# Patient Record
Sex: Female | Born: 1945 | Race: White | Hispanic: No | Smoking: Never smoker
Health system: Southern US, Community
[De-identification: ages and names within clinical notes are randomized; demographics above are authoritative.]

## PROBLEM LIST (undated history)

## (undated) DIAGNOSIS — R32 Unspecified urinary incontinence: Secondary | ICD-10-CM

## (undated) DIAGNOSIS — I10 Essential (primary) hypertension: Secondary | ICD-10-CM

## (undated) DIAGNOSIS — Z8673 Personal history of transient ischemic attack (TIA), and cerebral infarction without residual deficits: Secondary | ICD-10-CM

## (undated) DIAGNOSIS — D61818 Other pancytopenia: Secondary | ICD-10-CM

## (undated) DIAGNOSIS — F0390 Unspecified dementia without behavioral disturbance: Secondary | ICD-10-CM

## (undated) DIAGNOSIS — F419 Anxiety disorder, unspecified: Secondary | ICD-10-CM

## (undated) DIAGNOSIS — R001 Bradycardia, unspecified: Secondary | ICD-10-CM

## (undated) DIAGNOSIS — E039 Hypothyroidism, unspecified: Secondary | ICD-10-CM

## (undated) DIAGNOSIS — D649 Anemia, unspecified: Secondary | ICD-10-CM

## (undated) DIAGNOSIS — E785 Hyperlipidemia, unspecified: Secondary | ICD-10-CM

## (undated) HISTORY — DX: Personal history of transient ischemic attack (TIA), and cerebral infarction without residual deficits: Z86.73

## (undated) HISTORY — DX: Bradycardia, unspecified: R00.1

## (undated) HISTORY — DX: Essential (primary) hypertension: I10

## (undated) HISTORY — DX: Other pancytopenia: D61.818

## (undated) HISTORY — DX: Anemia, unspecified: D64.9

## (undated) HISTORY — PX: HEMORRHOID SURGERY: SHX153

## (undated) HISTORY — PX: STOMACH SURGERY: SHX791

## (undated) HISTORY — DX: Unspecified dementia, unspecified severity, without behavioral disturbance, psychotic disturbance, mood disturbance, and anxiety: F03.90

## (undated) HISTORY — DX: Unspecified urinary incontinence: R32

## (undated) HISTORY — DX: Anxiety disorder, unspecified: F41.9

## (undated) HISTORY — DX: Hyperlipidemia, unspecified: E78.5

## (undated) HISTORY — PX: TUBAL LIGATION: SHX77

## (undated) HISTORY — DX: Hypothyroidism, unspecified: E03.9

---

## 2005-03-16 ENCOUNTER — Emergency Department (HOSPITAL_COMMUNITY): Admission: EM | Admit: 2005-03-16 | Discharge: 2005-03-16 | Payer: Self-pay | Admitting: Emergency Medicine

## 2006-11-30 ENCOUNTER — Emergency Department (HOSPITAL_COMMUNITY): Admission: EM | Admit: 2006-11-30 | Discharge: 2006-11-30 | Payer: Self-pay | Admitting: Emergency Medicine

## 2007-03-01 ENCOUNTER — Emergency Department (HOSPITAL_COMMUNITY): Admission: EM | Admit: 2007-03-01 | Discharge: 2007-03-02 | Payer: Self-pay | Admitting: Emergency Medicine

## 2009-03-06 IMAGING — CT CT EXTREM LOW W/O CM*R*
2 series · 15 of 20 positions shown, 18 images · IV contrast (agent unspecified)
Comparison: none

CLINICAL DATA: Fall, pain.  
CT OF THE RIGHT ANKLE WITHOUT CONTRAST:
TECHNIQUE: Multidetector CT imaging was performed according to the standard protocol.  Multiplanar CT image reconstructions were also generated.

[Series 4: extremity soft tissue 4.0 b30s · axial · 0.74mm/px · z∈[-244,-72]mm · 12 of 51 slices shown, 15 images]
[im 4/51  soft-tissue]
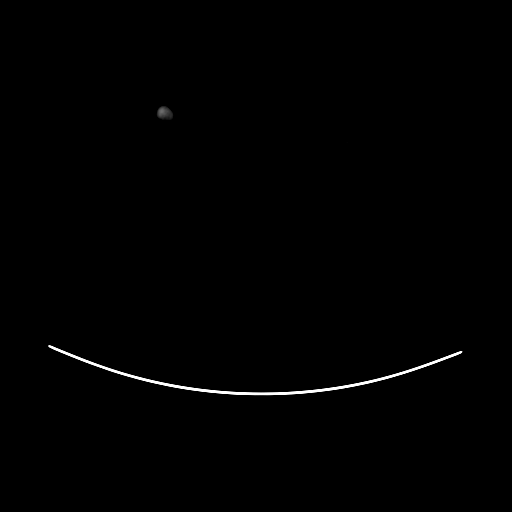
[im 4/51  bone]
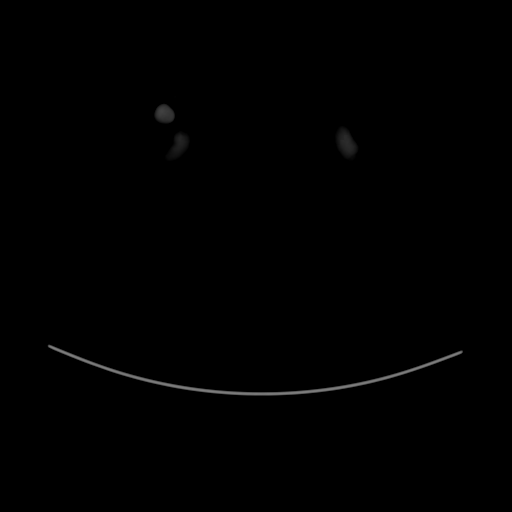
[im 8/51  bone]
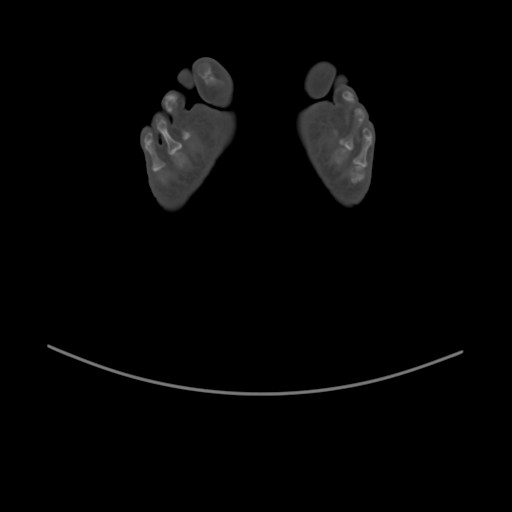
[im 12/51  bone]
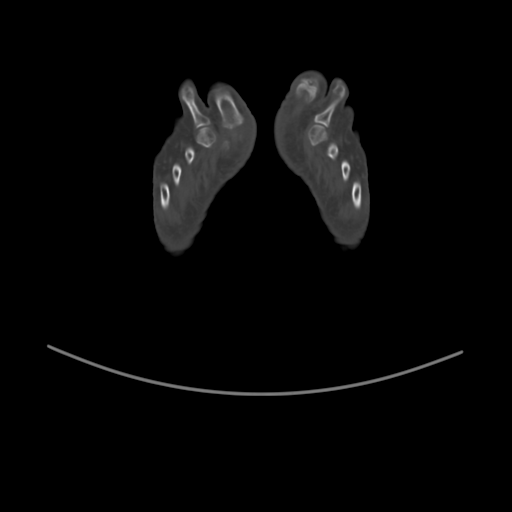
[im 16/51  bone]
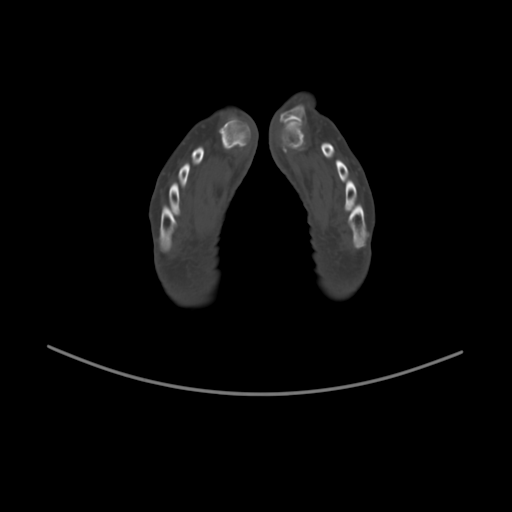
[im 20/51  soft-tissue]
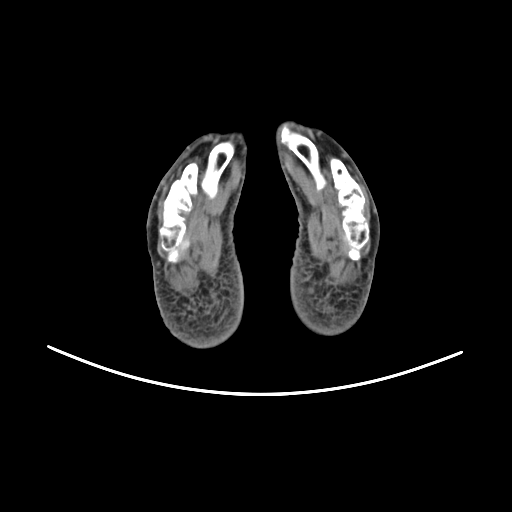
[im 20/51  bone]
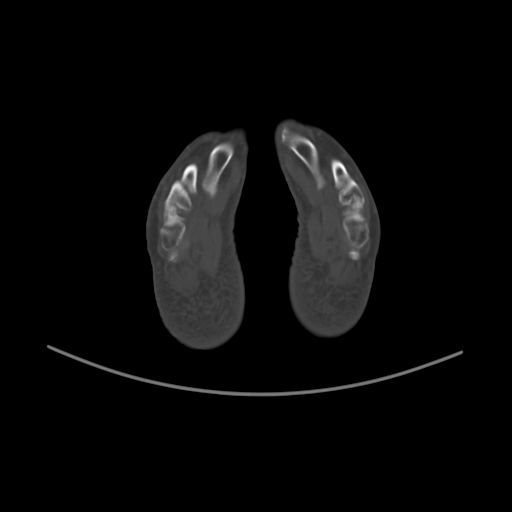
[im 24/51  bone]
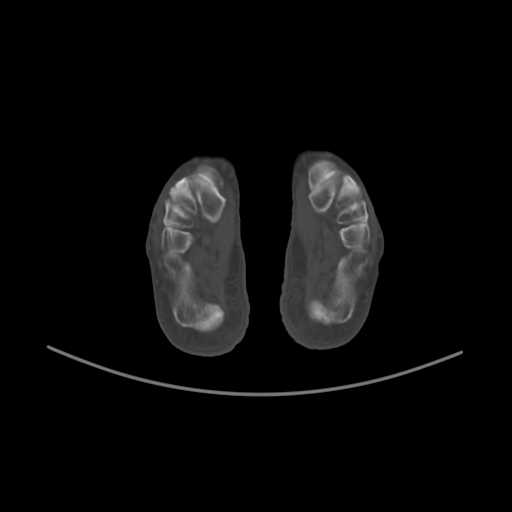
[im 27/51  bone]
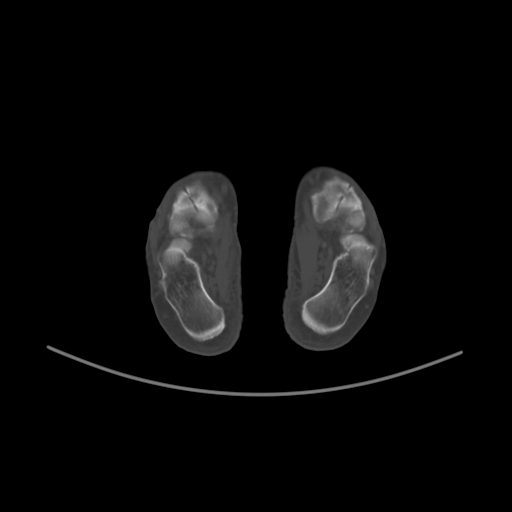
[im 31/51  bone]
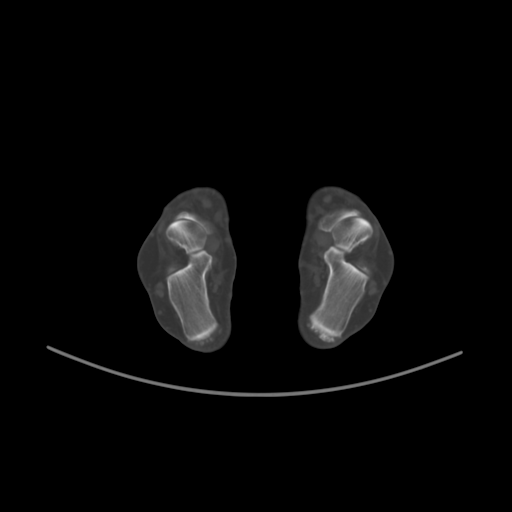
[im 35/51  soft-tissue]
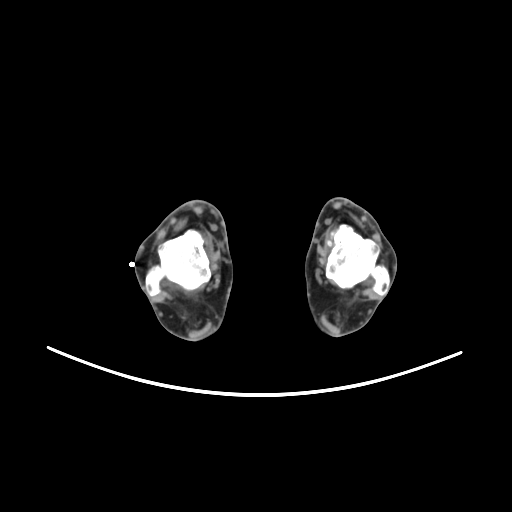
[im 35/51  bone]
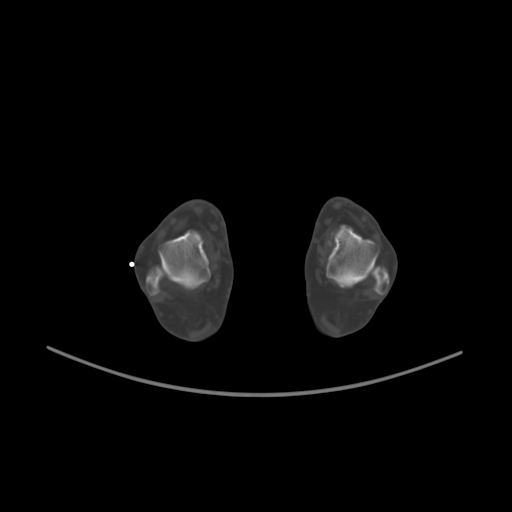
[im 39/51  bone]
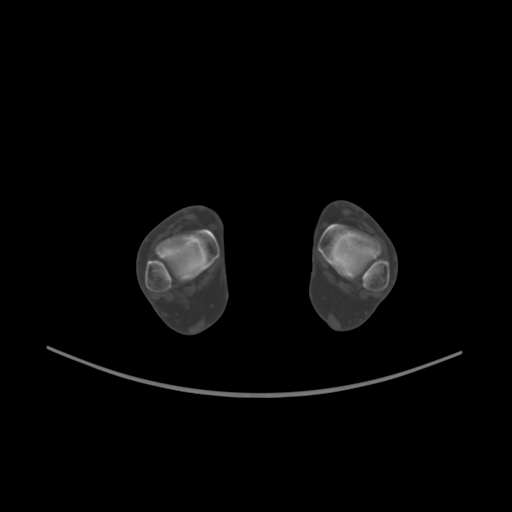
[im 43/51  bone]
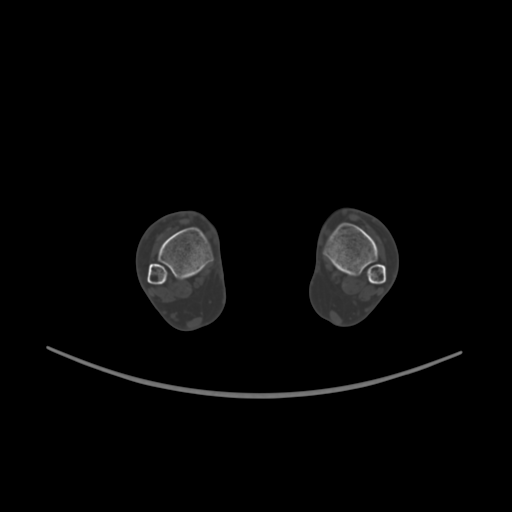
[im 47/51  bone]
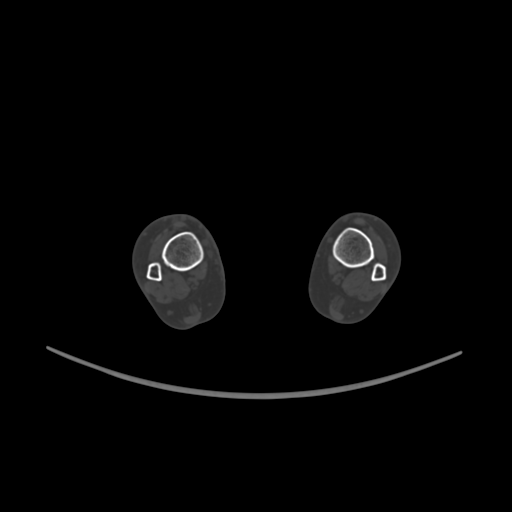

[Series 8: extremity 4.0 spo cor · coronal · 0.23mm/px · 3 of 33 slices shown]
[im 7/33  bone]
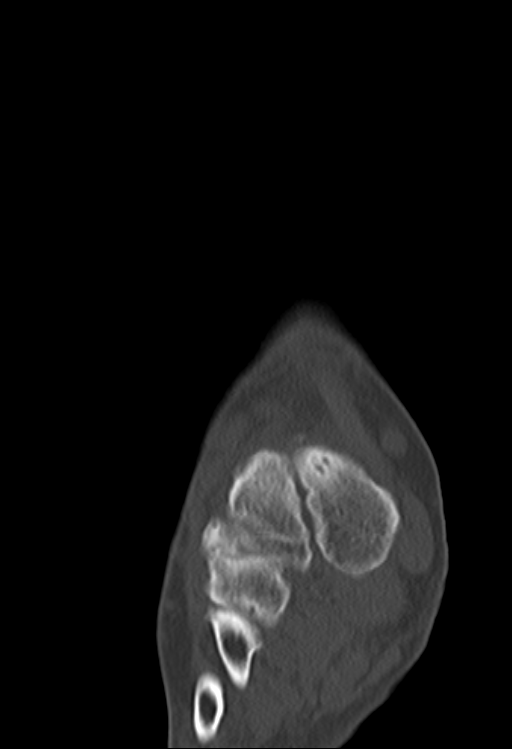
[im 13/33  bone]
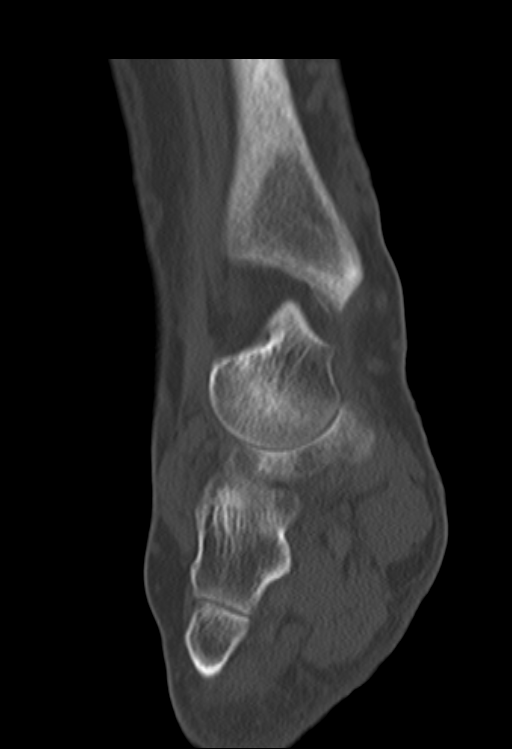
[im 20/33  bone]
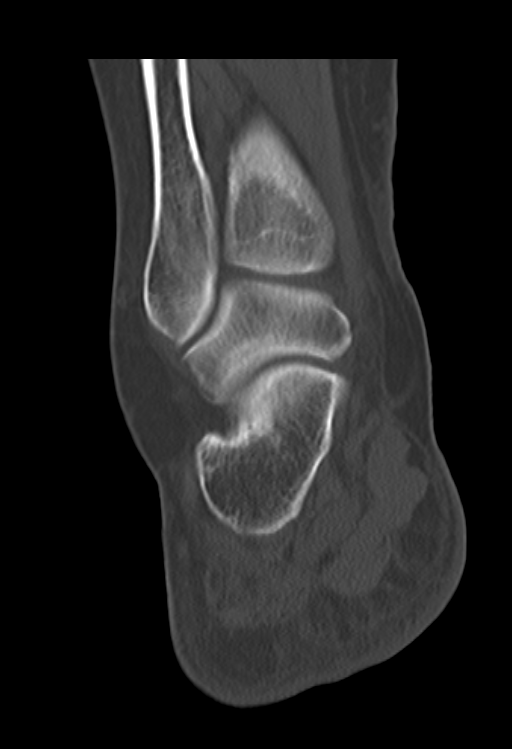

[15 of 20 positions shown; findings below may reference images not displayed]

FINDINGS: The left ankle is incidentally imaged in the study.  No fracture is identified.  There is some degenerative change about the midfoot.  There is mildly increased sclerosis in the medial aspect of the talus compared to lateral aspect suspicious for an osteochondral lesion.  Osteochondral lesion is clearly visualized in the left medial talus with cyst formation.  Soft tissue structures are unremarkable.
IMPRESSION: 1.  Negative for fracture. 
2.  Findings suspicious for a small osteochondral lesion in the medial talar dome on the right with more conspicuous osteochondral lesion seen in the left medial talar dome. MRI could be used for further evaluation.

## 2011-07-31 DIAGNOSIS — I6789 Other cerebrovascular disease: Secondary | ICD-10-CM

## 2015-05-30 DIAGNOSIS — Z0000 Encounter for general adult medical examination without abnormal findings: Secondary | ICD-10-CM | POA: Diagnosis not present

## 2015-05-30 DIAGNOSIS — E784 Other hyperlipidemia: Secondary | ICD-10-CM | POA: Diagnosis not present

## 2015-05-30 DIAGNOSIS — I10 Essential (primary) hypertension: Secondary | ICD-10-CM | POA: Diagnosis not present

## 2015-07-24 DIAGNOSIS — Z79899 Other long term (current) drug therapy: Secondary | ICD-10-CM | POA: Diagnosis not present

## 2015-07-24 DIAGNOSIS — R110 Nausea: Secondary | ICD-10-CM | POA: Diagnosis not present

## 2015-07-24 DIAGNOSIS — D649 Anemia, unspecified: Secondary | ICD-10-CM | POA: Diagnosis not present

## 2015-07-24 DIAGNOSIS — Z8673 Personal history of transient ischemic attack (TIA), and cerebral infarction without residual deficits: Secondary | ICD-10-CM | POA: Diagnosis not present

## 2015-07-24 DIAGNOSIS — R008 Other abnormalities of heart beat: Secondary | ICD-10-CM | POA: Diagnosis not present

## 2015-07-24 DIAGNOSIS — R001 Bradycardia, unspecified: Secondary | ICD-10-CM | POA: Diagnosis not present

## 2015-07-24 DIAGNOSIS — Z7982 Long term (current) use of aspirin: Secondary | ICD-10-CM | POA: Diagnosis not present

## 2015-07-24 DIAGNOSIS — D6489 Other specified anemias: Secondary | ICD-10-CM | POA: Diagnosis not present

## 2015-07-24 DIAGNOSIS — E784 Other hyperlipidemia: Secondary | ICD-10-CM | POA: Diagnosis not present

## 2015-07-24 DIAGNOSIS — E785 Hyperlipidemia, unspecified: Secondary | ICD-10-CM | POA: Diagnosis not present

## 2015-07-24 DIAGNOSIS — T447X5A Adverse effect of beta-adrenoreceptor antagonists, initial encounter: Secondary | ICD-10-CM | POA: Diagnosis not present

## 2015-07-24 DIAGNOSIS — Z885 Allergy status to narcotic agent status: Secondary | ICD-10-CM | POA: Diagnosis not present

## 2015-07-24 DIAGNOSIS — Z780 Asymptomatic menopausal state: Secondary | ICD-10-CM | POA: Diagnosis not present

## 2015-07-24 DIAGNOSIS — R42 Dizziness and giddiness: Secondary | ICD-10-CM | POA: Diagnosis not present

## 2015-07-25 DIAGNOSIS — Z8673 Personal history of transient ischemic attack (TIA), and cerebral infarction without residual deficits: Secondary | ICD-10-CM | POA: Diagnosis not present

## 2015-07-25 DIAGNOSIS — T447X5A Adverse effect of beta-adrenoreceptor antagonists, initial encounter: Secondary | ICD-10-CM | POA: Diagnosis not present

## 2015-07-25 DIAGNOSIS — E784 Other hyperlipidemia: Secondary | ICD-10-CM | POA: Diagnosis not present

## 2015-07-25 DIAGNOSIS — D6489 Other specified anemias: Secondary | ICD-10-CM | POA: Diagnosis not present

## 2015-07-25 DIAGNOSIS — R008 Other abnormalities of heart beat: Secondary | ICD-10-CM | POA: Diagnosis not present

## 2015-08-08 DIAGNOSIS — Z1389 Encounter for screening for other disorder: Secondary | ICD-10-CM | POA: Diagnosis not present

## 2015-08-08 DIAGNOSIS — E784 Other hyperlipidemia: Secondary | ICD-10-CM | POA: Diagnosis not present

## 2015-08-08 DIAGNOSIS — I10 Essential (primary) hypertension: Secondary | ICD-10-CM | POA: Diagnosis not present

## 2015-08-08 DIAGNOSIS — Z0000 Encounter for general adult medical examination without abnormal findings: Secondary | ICD-10-CM | POA: Diagnosis not present

## 2015-09-11 ENCOUNTER — Inpatient Hospital Stay: Admission: AD | Admit: 2015-09-11 | Payer: Self-pay | Source: Other Acute Inpatient Hospital | Admitting: Cardiology

## 2015-09-11 DIAGNOSIS — E785 Hyperlipidemia, unspecified: Secondary | ICD-10-CM | POA: Diagnosis not present

## 2015-09-11 DIAGNOSIS — Z79899 Other long term (current) drug therapy: Secondary | ICD-10-CM | POA: Diagnosis not present

## 2015-09-11 DIAGNOSIS — Z7982 Long term (current) use of aspirin: Secondary | ICD-10-CM | POA: Diagnosis not present

## 2015-09-11 DIAGNOSIS — I499 Cardiac arrhythmia, unspecified: Secondary | ICD-10-CM | POA: Diagnosis not present

## 2015-09-11 DIAGNOSIS — D638 Anemia in other chronic diseases classified elsewhere: Secondary | ICD-10-CM | POA: Diagnosis not present

## 2015-09-11 DIAGNOSIS — R001 Bradycardia, unspecified: Secondary | ICD-10-CM | POA: Diagnosis not present

## 2015-09-11 DIAGNOSIS — Y9289 Other specified places as the place of occurrence of the external cause: Secondary | ICD-10-CM | POA: Diagnosis not present

## 2015-09-11 DIAGNOSIS — R008 Other abnormalities of heart beat: Secondary | ICD-10-CM | POA: Diagnosis not present

## 2015-09-11 DIAGNOSIS — R42 Dizziness and giddiness: Secondary | ICD-10-CM | POA: Diagnosis not present

## 2015-09-11 DIAGNOSIS — E038 Other specified hypothyroidism: Secondary | ICD-10-CM | POA: Diagnosis not present

## 2015-09-11 DIAGNOSIS — T447X1A Poisoning by beta-adrenoreceptor antagonists, accidental (unintentional), initial encounter: Secondary | ICD-10-CM | POA: Diagnosis not present

## 2015-09-11 DIAGNOSIS — Z885 Allergy status to narcotic agent status: Secondary | ICD-10-CM | POA: Diagnosis not present

## 2015-09-11 DIAGNOSIS — E039 Hypothyroidism, unspecified: Secondary | ICD-10-CM | POA: Diagnosis not present

## 2015-09-12 ENCOUNTER — Encounter (HOSPITAL_COMMUNITY): Admission: RE | Payer: Self-pay | Source: Ambulatory Visit

## 2015-09-12 ENCOUNTER — Ambulatory Visit (HOSPITAL_COMMUNITY): Admission: RE | Admit: 2015-09-12 | Payer: Medicare Other | Source: Ambulatory Visit | Admitting: Internal Medicine

## 2015-09-12 DIAGNOSIS — E038 Other specified hypothyroidism: Secondary | ICD-10-CM | POA: Diagnosis not present

## 2015-09-12 DIAGNOSIS — R008 Other abnormalities of heart beat: Secondary | ICD-10-CM | POA: Diagnosis not present

## 2015-09-12 DIAGNOSIS — D638 Anemia in other chronic diseases classified elsewhere: Secondary | ICD-10-CM | POA: Diagnosis not present

## 2015-09-12 DIAGNOSIS — Y9289 Other specified places as the place of occurrence of the external cause: Secondary | ICD-10-CM | POA: Diagnosis not present

## 2015-09-12 DIAGNOSIS — T447X1A Poisoning by beta-adrenoreceptor antagonists, accidental (unintentional), initial encounter: Secondary | ICD-10-CM | POA: Diagnosis not present

## 2015-09-12 SURGERY — PACEMAKER IMPLANT
Anesthesia: LOCAL

## 2015-09-19 DIAGNOSIS — I10 Essential (primary) hypertension: Secondary | ICD-10-CM | POA: Diagnosis not present

## 2015-09-19 DIAGNOSIS — E784 Other hyperlipidemia: Secondary | ICD-10-CM | POA: Diagnosis not present

## 2015-09-19 DIAGNOSIS — R008 Other abnormalities of heart beat: Secondary | ICD-10-CM | POA: Diagnosis not present

## 2015-10-26 DIAGNOSIS — E784 Other hyperlipidemia: Secondary | ICD-10-CM | POA: Diagnosis not present

## 2015-10-26 DIAGNOSIS — R079 Chest pain, unspecified: Secondary | ICD-10-CM | POA: Diagnosis not present

## 2015-10-26 DIAGNOSIS — D61818 Other pancytopenia: Secondary | ICD-10-CM | POA: Diagnosis not present

## 2015-10-26 DIAGNOSIS — R001 Bradycardia, unspecified: Secondary | ICD-10-CM | POA: Diagnosis not present

## 2015-10-26 DIAGNOSIS — D649 Anemia, unspecified: Secondary | ICD-10-CM | POA: Diagnosis not present

## 2015-10-26 DIAGNOSIS — G459 Transient cerebral ischemic attack, unspecified: Secondary | ICD-10-CM | POA: Diagnosis not present

## 2015-10-26 DIAGNOSIS — R42 Dizziness and giddiness: Secondary | ICD-10-CM | POA: Diagnosis not present

## 2015-10-26 DIAGNOSIS — G458 Other transient cerebral ischemic attacks and related syndromes: Secondary | ICD-10-CM | POA: Diagnosis not present

## 2015-10-26 DIAGNOSIS — R008 Other abnormalities of heart beat: Secondary | ICD-10-CM | POA: Diagnosis not present

## 2015-10-26 DIAGNOSIS — E785 Hyperlipidemia, unspecified: Secondary | ICD-10-CM | POA: Diagnosis not present

## 2015-10-26 DIAGNOSIS — R404 Transient alteration of awareness: Secondary | ICD-10-CM | POA: Diagnosis not present

## 2015-10-27 DIAGNOSIS — D61818 Other pancytopenia: Secondary | ICD-10-CM | POA: Diagnosis not present

## 2015-10-27 DIAGNOSIS — G458 Other transient cerebral ischemic attacks and related syndromes: Secondary | ICD-10-CM | POA: Diagnosis not present

## 2015-10-27 DIAGNOSIS — R008 Other abnormalities of heart beat: Secondary | ICD-10-CM | POA: Diagnosis not present

## 2015-10-27 DIAGNOSIS — E784 Other hyperlipidemia: Secondary | ICD-10-CM | POA: Diagnosis not present

## 2015-10-28 ENCOUNTER — Encounter: Payer: Self-pay | Admitting: Cardiology

## 2015-10-28 NOTE — Progress Notes (Signed)
Cardiology Office Note  Date: 10/29/2015   ID: Natalie, Bridges 08-09-1945, MRN [id number]  PCP: Neale Burly, MD  Consulting Cardiologist: Rozann Lesches, MD   Chief Complaint  Patient presents with  . Bradycardia    History of Present Illness: Natalie Bridges is a 70 y.o. female referred for cardiology consultation by Dr. Sherrie Sport. I reviewed the available records. She was recently admitted to [facility] with dizziness, noted to be bradycardic, and admitted for observation. ECG showed sinus bradycardia with heart rate 39 beats per minute, decreased R wave progression, nonspecific T-wave changes. Troponin T levels were negative for ACS. She has a previous history of stroke, and had a follow-up head CT that showed evidence of old right MCA territory infarct but no acute findings. Discharge summary indicates that she was hydrated and discharged in stable condition. She was not on any AV nodal blocking drugs.  She tells me that she "feels fine" since hospital discharge. The event in question occurred after she had been outdoors mowing her grass with a self-propelled lawnmower. She went inside to sit down, had felt fine while she was outdoors, and then started to feel shaky and lightheaded as if she might pass out. At no point did she feel palpitations or chest pain. 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 Poor R-wave progression noted, rule out old anterior infarct pattern. She does not report any history of cardiac arrhythmia, CAD, or myocardial infarction.  Past Medical History:  Diagnosis Date  . Anemia   . Bradycardia   . Essential hypertension   . History of stroke   . Hyperlipemia   . Pancytopenia ([place])   . Urinary incontinence     Past Surgical History:  Procedure Laterality Date  . HEMORRHOID SURGERY    . TUBAL LIGATION       Current Outpatient Prescriptions  Medication Sig Dispense Refill  . amLODipine (NORVASC) 5 MG tablet Take 5 mg by mouth daily.    Marland Kitchen aspirin 325 MG EC tablet Take 325 mg by mouth daily.    Marland Kitchen atorvastatin (LIPITOR) 10 MG tablet Take 10 mg by mouth daily.    . ferrous sulfate 325 (65 FE) MG tablet Take 325 mg by mouth daily with breakfast.    . Omega-3 Fatty Acids (FISH OIL) 1000 MG CAPS Take 10,000 mg by mouth 3 (three) times daily.    Marland Kitchen oxybutynin (DITROPAN-XL) 5 MG 24 hr tablet Take 5 mg by mouth at bedtime.    . potassium chloride (K-DUR) 10 MEQ tablet Take 10 mEq by mouth daily.    . potassium chloride (MICRO-K) 10 MEQ CR capsule Take 10 mEq by mouth 2 (two) times daily.     No current facility-administered medications for this visit.    Allergies:  Codeine and Hydrocodone   Social History: The patient  reports that she has never smoked. She has never used smokeless tobacco. She reports that she does not drink alcohol or use drugs.   Family History: The patient's family history is not on file. She was adopted.   ROS:  Please see the history of present illness. Otherwise, complete review of systems is positive for NYHA class II dyspnea.  All other systems are reviewed and negative.   Physical Exam: VS:  BP 106/68 Comment: left arm  Pulse 64   Ht 5\' 5"  (1.651 m)  Wt 168 lb (76.2 kg)   LMP  (LMP Unknown)   SpO2 96%   BMI 27.96 kg/m , BMI Body mass index is 27.96 kg/m.  Wt Readings from Last 3 Encounters:  10/29/15 168 lb (76.2 kg)    General: Overweight woman, appears comfortable at rest. HEENT: Conjunctiva and lids normal, oropharynx clear. Neck: Supple, no elevated JVP or carotid bruits, no thyromegaly. Lungs: Clear to auscultation, nonlabored breathing at rest. Cardiac: Regular rate and rhythm, no S3 or significant systolic murmur, no pericardial rub. Abdomen: Soft, nontender, bowel sounds present, no guarding or rebound. Extremities: Spider veins and venous  varicosities in her lower legs, distal pulses 2+. Skin: Warm and dry. Musculoskeletal: No kyphosis. Neuropsychiatric: Alert and oriented x3, affect grossly appropriate.  ECG: There is no old tracing for comparison.  Recent Labwork:   August 2017: BUN 8, creatinine 0.7, potassium 3.4, AST 13, ALT 8, hemoglobin 10.6, platelets 145, the PVC 2.4  Assessment and Plan:  1. Recent episode of near-syncope at rest associated with subsequently documented bradycardia, although no definite heart block or other arrhythmias documented during hospital observation. States that she improved with hydration and has felt fine since hospital discharge. Could have potentially had a vasovagal reaction. Her ECG does suggest some component of conduction system disease with incomplete right bundle branch block and left anterior fascicular block however. Also poor R-wave progression, rule out old anterior infarct pattern. She is not reporting any angina with exertion. Plan is to obtain a 7 day event monitor to evaluate heart rate variability. Will also obtain an echocardiogram to assess cardiac structure and function.  2. Essential hypertension, currently on Norvasc. This would not be expected to have significant AV nodal blocking properties and contribute to bradycardia.  3. Previous history of stroke. No new neurological symptoms in terms of weakness or speech/memory problems. Head CT done at [facility] was consistent with old right MCA distribution infarct.  4. Hyperlipidemia, on Lipitor.  Current medicines were reviewed with the patient today.   Orders Placed This Encounter  Procedures  . Cardiac event monitor  . EKG 12-Lead  . ECHOCARDIOGRAM COMPLETE    Disposition: Call with results.  Signed, Satira Sark, MD, [facility] 10/29/2015 1:28 PM    [facility] [address] [place], [place] [address] Phone: [phone]; Fax: [phone]

## 2015-10-29 ENCOUNTER — Encounter: Payer: Self-pay | Admitting: Cardiology

## 2015-10-29 ENCOUNTER — Ambulatory Visit (INDEPENDENT_AMBULATORY_CARE_PROVIDER_SITE_OTHER): Payer: Medicare Other | Admitting: Cardiology

## 2015-10-29 VITALS — BP 106/68 | HR 64 | Ht 65.0 in | Wt 168.0 lb

## 2015-10-29 DIAGNOSIS — I10 Essential (primary) hypertension: Secondary | ICD-10-CM

## 2015-10-29 DIAGNOSIS — R001 Bradycardia, unspecified: Secondary | ICD-10-CM | POA: Diagnosis not present

## 2015-10-29 DIAGNOSIS — R9431 Abnormal electrocardiogram [ECG] [EKG]: Secondary | ICD-10-CM

## 2015-10-29 DIAGNOSIS — R55 Syncope and collapse: Secondary | ICD-10-CM | POA: Diagnosis not present

## 2015-10-29 NOTE — Patient Instructions (Signed)
Your physician recommends that you schedule a follow-up appointment in: we will call you with results    Your physician recommends that you continue on your current medications as directed. Please refer to the Current Medication list given to you today.    Your physician has requested that you have an echocardiogram at the [facility]. Echocardiography is a painless test that uses sound waves to create images of your heart. It provides your doctor with information about the size and shape of your heart and how well your heart's chambers and valves are working. This procedure takes approximately one hour. There are no restrictions for this procedure.   Your physician has recommended that you wear an event monitor for 7 days. Event monitors are medical devices that record the heart's electrical activity. Doctors most often [place] these monitors to diagnose arrhythmias. Arrhythmias are problems with the speed or rhythm of the heartbeat. The monitor is a small, portable device. You can wear one while you do your normal daily activities. This is usually used to diagnose what is causing palpitations/syncope (passing out).       Thank you for choosing [facility] !

## 2015-11-04 ENCOUNTER — Telehealth: Payer: Self-pay | Admitting: Cardiology
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
## 2015-11-04 NOTE — Telephone Encounter (Signed)
Patient's daughter has concerns about event monitor ordered at visit on 8/22. / tg

## 2015-11-05 ENCOUNTER — Ambulatory Visit (INDEPENDENT_AMBULATORY_CARE_PROVIDER_SITE_OTHER): Payer: Medicare Other

## 2015-11-05 DIAGNOSIS — R55 Syncope and collapse: Secondary | ICD-10-CM | POA: Diagnosis not present

## 2015-11-05 DIAGNOSIS — R001 Bradycardia, unspecified: Secondary | ICD-10-CM | POA: Diagnosis not present

## 2015-11-10 DIAGNOSIS — R008 Other abnormalities of heart beat: Secondary | ICD-10-CM | POA: Diagnosis not present

## 2015-11-10 DIAGNOSIS — E785 Hyperlipidemia, unspecified: Secondary | ICD-10-CM | POA: Diagnosis not present

## 2015-11-10 DIAGNOSIS — R0789 Other chest pain: Secondary | ICD-10-CM | POA: Diagnosis not present

## 2015-11-10 DIAGNOSIS — R079 Chest pain, unspecified: Secondary | ICD-10-CM | POA: Diagnosis not present

## 2015-11-10 DIAGNOSIS — Z7982 Long term (current) use of aspirin: Secondary | ICD-10-CM | POA: Diagnosis not present

## 2015-11-10 DIAGNOSIS — R001 Bradycardia, unspecified: Secondary | ICD-10-CM | POA: Diagnosis not present

## 2015-11-10 DIAGNOSIS — Z8673 Personal history of transient ischemic attack (TIA), and cerebral infarction without residual deficits: Secondary | ICD-10-CM | POA: Diagnosis not present

## 2015-11-10 DIAGNOSIS — I10 Essential (primary) hypertension: Secondary | ICD-10-CM | POA: Diagnosis not present

## 2015-11-10 DIAGNOSIS — Z885 Allergy status to narcotic agent status: Secondary | ICD-10-CM | POA: Diagnosis not present

## 2015-11-10 DIAGNOSIS — E784 Other hyperlipidemia: Secondary | ICD-10-CM | POA: Diagnosis not present

## 2015-11-10 DIAGNOSIS — Z79899 Other long term (current) drug therapy: Secondary | ICD-10-CM | POA: Diagnosis not present

## 2015-11-11 DIAGNOSIS — R0789 Other chest pain: Secondary | ICD-10-CM | POA: Diagnosis not present

## 2015-11-11 DIAGNOSIS — I10 Essential (primary) hypertension: Secondary | ICD-10-CM | POA: Diagnosis not present

## 2015-11-11 DIAGNOSIS — E784 Other hyperlipidemia: Secondary | ICD-10-CM | POA: Diagnosis not present

## 2015-11-11 DIAGNOSIS — R008 Other abnormalities of heart beat: Secondary | ICD-10-CM | POA: Diagnosis not present

## 2015-11-13 ENCOUNTER — Other Ambulatory Visit: Payer: Self-pay

## 2015-11-13 ENCOUNTER — Ambulatory Visit (INDEPENDENT_AMBULATORY_CARE_PROVIDER_SITE_OTHER): Payer: Medicare Other

## 2015-11-13 DIAGNOSIS — R001 Bradycardia, unspecified: Secondary | ICD-10-CM | POA: Diagnosis not present

## 2015-11-13 DIAGNOSIS — R55 Syncope and collapse: Secondary | ICD-10-CM | POA: Diagnosis not present

## 2015-11-14 DIAGNOSIS — K219 Gastro-esophageal reflux disease without esophagitis: Secondary | ICD-10-CM | POA: Diagnosis not present

## 2015-11-14 DIAGNOSIS — E784 Other hyperlipidemia: Secondary | ICD-10-CM | POA: Diagnosis not present

## 2015-11-14 DIAGNOSIS — I10 Essential (primary) hypertension: Secondary | ICD-10-CM | POA: Diagnosis not present

## 2015-12-30 DIAGNOSIS — K219 Gastro-esophageal reflux disease without esophagitis: Secondary | ICD-10-CM | POA: Diagnosis not present

## 2015-12-30 DIAGNOSIS — E784 Other hyperlipidemia: Secondary | ICD-10-CM | POA: Diagnosis not present

## 2015-12-30 DIAGNOSIS — I10 Essential (primary) hypertension: Secondary | ICD-10-CM | POA: Diagnosis not present

## 2016-02-28 DIAGNOSIS — R42 Dizziness and giddiness: Secondary | ICD-10-CM | POA: Diagnosis not present

## 2016-02-28 DIAGNOSIS — R531 Weakness: Secondary | ICD-10-CM | POA: Diagnosis not present

## 2016-02-28 DIAGNOSIS — H532 Diplopia: Secondary | ICD-10-CM | POA: Diagnosis not present

## 2016-02-28 DIAGNOSIS — E784 Other hyperlipidemia: Secondary | ICD-10-CM | POA: Diagnosis not present

## 2016-02-28 DIAGNOSIS — G458 Other transient cerebral ischemic attacks and related syndromes: Secondary | ICD-10-CM | POA: Diagnosis not present

## 2016-02-28 DIAGNOSIS — S0990XA Unspecified injury of head, initial encounter: Secondary | ICD-10-CM | POA: Diagnosis not present

## 2016-02-28 DIAGNOSIS — S299XXA Unspecified injury of thorax, initial encounter: Secondary | ICD-10-CM | POA: Diagnosis not present

## 2016-02-28 DIAGNOSIS — K8012 Calculus of gallbladder with acute and chronic cholecystitis without obstruction: Secondary | ICD-10-CM | POA: Diagnosis not present

## 2016-02-28 DIAGNOSIS — R945 Abnormal results of liver function studies: Secondary | ICD-10-CM | POA: Diagnosis not present

## 2016-02-28 DIAGNOSIS — R55 Syncope and collapse: Secondary | ICD-10-CM | POA: Diagnosis not present

## 2016-02-28 DIAGNOSIS — R404 Transient alteration of awareness: Secondary | ICD-10-CM | POA: Diagnosis not present

## 2016-02-28 DIAGNOSIS — R748 Abnormal levels of other serum enzymes: Secondary | ICD-10-CM | POA: Diagnosis not present

## 2016-02-28 DIAGNOSIS — I10 Essential (primary) hypertension: Secondary | ICD-10-CM | POA: Diagnosis not present

## 2016-02-29 DIAGNOSIS — R42 Dizziness and giddiness: Secondary | ICD-10-CM | POA: Diagnosis not present

## 2016-02-29 DIAGNOSIS — R55 Syncope and collapse: Secondary | ICD-10-CM | POA: Diagnosis not present

## 2016-02-29 DIAGNOSIS — G458 Other transient cerebral ischemic attacks and related syndromes: Secondary | ICD-10-CM | POA: Diagnosis not present

## 2016-02-29 DIAGNOSIS — G459 Transient cerebral ischemic attack, unspecified: Secondary | ICD-10-CM | POA: Diagnosis not present

## 2016-02-29 DIAGNOSIS — R1011 Right upper quadrant pain: Secondary | ICD-10-CM | POA: Diagnosis not present

## 2016-02-29 DIAGNOSIS — K8012 Calculus of gallbladder with acute and chronic cholecystitis without obstruction: Secondary | ICD-10-CM | POA: Diagnosis not present

## 2016-02-29 DIAGNOSIS — S299XXA Unspecified injury of thorax, initial encounter: Secondary | ICD-10-CM | POA: Diagnosis not present

## 2016-02-29 DIAGNOSIS — I10 Essential (primary) hypertension: Secondary | ICD-10-CM | POA: Diagnosis not present

## 2016-02-29 DIAGNOSIS — E784 Other hyperlipidemia: Secondary | ICD-10-CM | POA: Diagnosis not present

## 2016-02-29 DIAGNOSIS — K810 Acute cholecystitis: Secondary | ICD-10-CM | POA: Diagnosis not present

## 2016-02-29 DIAGNOSIS — R748 Abnormal levels of other serum enzymes: Secondary | ICD-10-CM | POA: Diagnosis not present

## 2016-02-29 DIAGNOSIS — N959 Unspecified menopausal and perimenopausal disorder: Secondary | ICD-10-CM | POA: Diagnosis not present

## 2016-02-29 DIAGNOSIS — H532 Diplopia: Secondary | ICD-10-CM | POA: Diagnosis not present

## 2016-02-29 DIAGNOSIS — Z23 Encounter for immunization: Secondary | ICD-10-CM | POA: Diagnosis not present

## 2016-02-29 DIAGNOSIS — Z885 Allergy status to narcotic agent status: Secondary | ICD-10-CM | POA: Diagnosis not present

## 2016-02-29 DIAGNOSIS — S0990XA Unspecified injury of head, initial encounter: Secondary | ICD-10-CM | POA: Diagnosis not present

## 2016-02-29 DIAGNOSIS — E785 Hyperlipidemia, unspecified: Secondary | ICD-10-CM | POA: Diagnosis not present

## 2016-02-29 DIAGNOSIS — R945 Abnormal results of liver function studies: Secondary | ICD-10-CM | POA: Diagnosis not present

## 2016-03-01 DIAGNOSIS — I10 Essential (primary) hypertension: Secondary | ICD-10-CM | POA: Diagnosis not present

## 2016-03-01 DIAGNOSIS — K810 Acute cholecystitis: Secondary | ICD-10-CM | POA: Diagnosis not present

## 2016-03-02 DIAGNOSIS — Z23 Encounter for immunization: Secondary | ICD-10-CM | POA: Diagnosis not present

## 2016-03-10 DIAGNOSIS — E784 Other hyperlipidemia: Secondary | ICD-10-CM | POA: Diagnosis not present

## 2016-03-10 DIAGNOSIS — K810 Acute cholecystitis: Secondary | ICD-10-CM | POA: Diagnosis not present

## 2016-03-10 DIAGNOSIS — G3184 Mild cognitive impairment, so stated: Secondary | ICD-10-CM | POA: Diagnosis not present

## 2016-03-10 DIAGNOSIS — I10 Essential (primary) hypertension: Secondary | ICD-10-CM | POA: Diagnosis not present

## 2016-03-10 DIAGNOSIS — Z6823 Body mass index (BMI) 23.0-23.9, adult: Secondary | ICD-10-CM | POA: Diagnosis not present

## 2016-03-13 DIAGNOSIS — R0789 Other chest pain: Secondary | ICD-10-CM | POA: Diagnosis not present

## 2016-03-13 DIAGNOSIS — R945 Abnormal results of liver function studies: Secondary | ICD-10-CM | POA: Diagnosis not present

## 2016-03-13 DIAGNOSIS — R935 Abnormal findings on diagnostic imaging of other abdominal regions, including retroperitoneum: Secondary | ICD-10-CM | POA: Diagnosis not present

## 2016-03-13 DIAGNOSIS — R061 Stridor: Secondary | ICD-10-CM | POA: Diagnosis not present

## 2016-03-13 DIAGNOSIS — R072 Precordial pain: Secondary | ICD-10-CM | POA: Diagnosis not present

## 2016-03-13 DIAGNOSIS — K858 Other acute pancreatitis without necrosis or infection: Secondary | ICD-10-CM | POA: Diagnosis not present

## 2016-03-13 DIAGNOSIS — E876 Hypokalemia: Secondary | ICD-10-CM | POA: Diagnosis not present

## 2016-03-13 DIAGNOSIS — R079 Chest pain, unspecified: Secondary | ICD-10-CM | POA: Diagnosis not present

## 2016-03-13 DIAGNOSIS — R7989 Other specified abnormal findings of blood chemistry: Secondary | ICD-10-CM | POA: Diagnosis not present

## 2016-03-13 DIAGNOSIS — K830 Cholangitis: Secondary | ICD-10-CM | POA: Diagnosis not present

## 2016-03-13 DIAGNOSIS — E441 Mild protein-calorie malnutrition: Secondary | ICD-10-CM | POA: Diagnosis not present

## 2016-03-13 DIAGNOSIS — I209 Angina pectoris, unspecified: Secondary | ICD-10-CM | POA: Diagnosis not present

## 2016-03-14 DIAGNOSIS — R7989 Other specified abnormal findings of blood chemistry: Secondary | ICD-10-CM | POA: Diagnosis not present

## 2016-03-14 DIAGNOSIS — I10 Essential (primary) hypertension: Secondary | ICD-10-CM | POA: Diagnosis not present

## 2016-03-14 DIAGNOSIS — Z8673 Personal history of transient ischemic attack (TIA), and cerebral infarction without residual deficits: Secondary | ICD-10-CM | POA: Diagnosis not present

## 2016-03-14 DIAGNOSIS — K219 Gastro-esophageal reflux disease without esophagitis: Secondary | ICD-10-CM | POA: Diagnosis not present

## 2016-03-14 DIAGNOSIS — E876 Hypokalemia: Secondary | ICD-10-CM | POA: Diagnosis not present

## 2016-03-14 DIAGNOSIS — Z6820 Body mass index (BMI) 20.0-20.9, adult: Secondary | ICD-10-CM | POA: Diagnosis not present

## 2016-03-14 DIAGNOSIS — R072 Precordial pain: Secondary | ICD-10-CM | POA: Diagnosis not present

## 2016-03-14 DIAGNOSIS — E441 Mild protein-calorie malnutrition: Secondary | ICD-10-CM | POA: Diagnosis not present

## 2016-03-14 DIAGNOSIS — Z886 Allergy status to analgesic agent status: Secondary | ICD-10-CM | POA: Diagnosis not present

## 2016-03-14 DIAGNOSIS — K859 Acute pancreatitis without necrosis or infection, unspecified: Secondary | ICD-10-CM | POA: Diagnosis not present

## 2016-03-14 DIAGNOSIS — K830 Cholangitis: Secondary | ICD-10-CM | POA: Diagnosis not present

## 2016-03-14 DIAGNOSIS — Z780 Asymptomatic menopausal state: Secondary | ICD-10-CM | POA: Diagnosis not present

## 2016-03-14 DIAGNOSIS — I714 Abdominal aortic aneurysm, without rupture: Secondary | ICD-10-CM | POA: Diagnosis not present

## 2016-03-14 DIAGNOSIS — Z7982 Long term (current) use of aspirin: Secondary | ICD-10-CM | POA: Diagnosis not present

## 2016-03-14 DIAGNOSIS — I209 Angina pectoris, unspecified: Secondary | ICD-10-CM | POA: Diagnosis not present

## 2016-03-14 DIAGNOSIS — R0789 Other chest pain: Secondary | ICD-10-CM | POA: Diagnosis not present

## 2016-03-14 DIAGNOSIS — K858 Other acute pancreatitis without necrosis or infection: Secondary | ICD-10-CM | POA: Diagnosis not present

## 2016-03-14 DIAGNOSIS — Z79899 Other long term (current) drug therapy: Secondary | ICD-10-CM | POA: Diagnosis not present

## 2016-03-14 DIAGNOSIS — R17 Unspecified jaundice: Secondary | ICD-10-CM | POA: Diagnosis not present

## 2016-03-14 DIAGNOSIS — R079 Chest pain, unspecified: Secondary | ICD-10-CM | POA: Diagnosis not present

## 2016-03-14 DIAGNOSIS — E785 Hyperlipidemia, unspecified: Secondary | ICD-10-CM | POA: Diagnosis not present

## 2016-03-23 DIAGNOSIS — K8582 Other acute pancreatitis with infected necrosis: Secondary | ICD-10-CM | POA: Diagnosis not present

## 2016-03-23 DIAGNOSIS — I10 Essential (primary) hypertension: Secondary | ICD-10-CM | POA: Diagnosis not present

## 2016-03-23 DIAGNOSIS — E784 Other hyperlipidemia: Secondary | ICD-10-CM | POA: Diagnosis not present

## 2016-03-23 DIAGNOSIS — Z6823 Body mass index (BMI) 23.0-23.9, adult: Secondary | ICD-10-CM | POA: Diagnosis not present

## 2016-06-09 DIAGNOSIS — Z131 Encounter for screening for diabetes mellitus: Secondary | ICD-10-CM | POA: Diagnosis not present

## 2016-06-09 DIAGNOSIS — Z1389 Encounter for screening for other disorder: Secondary | ICD-10-CM | POA: Diagnosis not present

## 2016-06-09 DIAGNOSIS — E038 Other specified hypothyroidism: Secondary | ICD-10-CM | POA: Diagnosis not present

## 2016-06-09 DIAGNOSIS — I10 Essential (primary) hypertension: Secondary | ICD-10-CM | POA: Diagnosis not present

## 2016-06-09 DIAGNOSIS — Z0000 Encounter for general adult medical examination without abnormal findings: Secondary | ICD-10-CM | POA: Diagnosis not present

## 2016-06-09 DIAGNOSIS — Z79899 Other long term (current) drug therapy: Secondary | ICD-10-CM | POA: Diagnosis not present

## 2016-06-09 DIAGNOSIS — Z6823 Body mass index (BMI) 23.0-23.9, adult: Secondary | ICD-10-CM | POA: Diagnosis not present

## 2016-06-09 DIAGNOSIS — E784 Other hyperlipidemia: Secondary | ICD-10-CM | POA: Diagnosis not present

## 2016-06-18 DIAGNOSIS — E785 Hyperlipidemia, unspecified: Secondary | ICD-10-CM | POA: Diagnosis not present

## 2016-06-18 DIAGNOSIS — R001 Bradycardia, unspecified: Secondary | ICD-10-CM | POA: Diagnosis not present

## 2016-06-18 DIAGNOSIS — Z8673 Personal history of transient ischemic attack (TIA), and cerebral infarction without residual deficits: Secondary | ICD-10-CM | POA: Diagnosis not present

## 2016-06-18 DIAGNOSIS — Z886 Allergy status to analgesic agent status: Secondary | ICD-10-CM | POA: Diagnosis not present

## 2016-06-18 DIAGNOSIS — E872 Acidosis: Secondary | ICD-10-CM | POA: Diagnosis not present

## 2016-06-18 DIAGNOSIS — R1111 Vomiting without nausea: Secondary | ICD-10-CM | POA: Diagnosis not present

## 2016-06-18 DIAGNOSIS — Z9049 Acquired absence of other specified parts of digestive tract: Secondary | ICD-10-CM | POA: Diagnosis not present

## 2016-06-18 DIAGNOSIS — R55 Syncope and collapse: Secondary | ICD-10-CM | POA: Diagnosis not present

## 2016-06-18 DIAGNOSIS — I714 Abdominal aortic aneurysm, without rupture: Secondary | ICD-10-CM | POA: Diagnosis not present

## 2016-06-18 DIAGNOSIS — I10 Essential (primary) hypertension: Secondary | ICD-10-CM | POA: Diagnosis not present

## 2016-06-18 DIAGNOSIS — K529 Noninfective gastroenteritis and colitis, unspecified: Secondary | ICD-10-CM | POA: Diagnosis not present

## 2016-06-18 DIAGNOSIS — R531 Weakness: Secondary | ICD-10-CM | POA: Diagnosis not present

## 2016-06-18 DIAGNOSIS — E873 Alkalosis: Secondary | ICD-10-CM | POA: Diagnosis not present

## 2016-06-18 DIAGNOSIS — Z7982 Long term (current) use of aspirin: Secondary | ICD-10-CM | POA: Diagnosis not present

## 2016-06-18 DIAGNOSIS — K5289 Other specified noninfective gastroenteritis and colitis: Secondary | ICD-10-CM | POA: Diagnosis not present

## 2016-06-18 DIAGNOSIS — R404 Transient alteration of awareness: Secondary | ICD-10-CM | POA: Diagnosis not present

## 2016-06-18 DIAGNOSIS — R064 Hyperventilation: Secondary | ICD-10-CM | POA: Diagnosis not present

## 2016-06-18 DIAGNOSIS — Z79899 Other long term (current) drug therapy: Secondary | ICD-10-CM | POA: Diagnosis not present

## 2016-06-18 DIAGNOSIS — K219 Gastro-esophageal reflux disease without esophagitis: Secondary | ICD-10-CM | POA: Diagnosis not present

## 2016-06-19 DIAGNOSIS — I10 Essential (primary) hypertension: Secondary | ICD-10-CM | POA: Diagnosis not present

## 2016-06-19 DIAGNOSIS — K5289 Other specified noninfective gastroenteritis and colitis: Secondary | ICD-10-CM | POA: Diagnosis not present

## 2016-06-19 DIAGNOSIS — R55 Syncope and collapse: Secondary | ICD-10-CM | POA: Diagnosis not present

## 2016-06-19 DIAGNOSIS — E873 Alkalosis: Secondary | ICD-10-CM | POA: Diagnosis not present

## 2016-06-19 DIAGNOSIS — E872 Acidosis: Secondary | ICD-10-CM | POA: Diagnosis not present

## 2016-06-20 DIAGNOSIS — R079 Chest pain, unspecified: Secondary | ICD-10-CM | POA: Diagnosis not present

## 2016-06-20 DIAGNOSIS — R0789 Other chest pain: Secondary | ICD-10-CM | POA: Diagnosis not present

## 2016-06-20 DIAGNOSIS — R112 Nausea with vomiting, unspecified: Secondary | ICD-10-CM | POA: Diagnosis not present

## 2016-06-20 DIAGNOSIS — R0602 Shortness of breath: Secondary | ICD-10-CM | POA: Diagnosis not present

## 2016-06-20 DIAGNOSIS — R42 Dizziness and giddiness: Secondary | ICD-10-CM | POA: Diagnosis not present

## 2016-06-20 DIAGNOSIS — R55 Syncope and collapse: Secondary | ICD-10-CM | POA: Diagnosis not present

## 2016-06-20 DIAGNOSIS — I714 Abdominal aortic aneurysm, without rupture: Secondary | ICD-10-CM | POA: Diagnosis not present

## 2016-06-20 DIAGNOSIS — I444 Left anterior fascicular block: Secondary | ICD-10-CM | POA: Diagnosis not present

## 2016-06-20 DIAGNOSIS — Z886 Allergy status to analgesic agent status: Secondary | ICD-10-CM | POA: Diagnosis not present

## 2016-06-20 DIAGNOSIS — Z7982 Long term (current) use of aspirin: Secondary | ICD-10-CM | POA: Diagnosis not present

## 2016-06-20 DIAGNOSIS — Z8673 Personal history of transient ischemic attack (TIA), and cerebral infarction without residual deficits: Secondary | ICD-10-CM | POA: Diagnosis not present

## 2016-06-20 DIAGNOSIS — Z9049 Acquired absence of other specified parts of digestive tract: Secondary | ICD-10-CM | POA: Diagnosis not present

## 2016-06-20 DIAGNOSIS — R109 Unspecified abdominal pain: Secondary | ICD-10-CM | POA: Diagnosis not present

## 2016-06-20 DIAGNOSIS — R001 Bradycardia, unspecified: Secondary | ICD-10-CM | POA: Diagnosis not present

## 2016-06-20 DIAGNOSIS — I10 Essential (primary) hypertension: Secondary | ICD-10-CM | POA: Diagnosis not present

## 2016-06-20 DIAGNOSIS — Z79899 Other long term (current) drug therapy: Secondary | ICD-10-CM | POA: Diagnosis not present

## 2016-06-20 DIAGNOSIS — R197 Diarrhea, unspecified: Secondary | ICD-10-CM | POA: Diagnosis not present

## 2016-06-20 DIAGNOSIS — E785 Hyperlipidemia, unspecified: Secondary | ICD-10-CM | POA: Diagnosis not present

## 2016-06-20 DIAGNOSIS — E873 Alkalosis: Secondary | ICD-10-CM | POA: Diagnosis not present

## 2016-06-20 DIAGNOSIS — R1084 Generalized abdominal pain: Secondary | ICD-10-CM | POA: Diagnosis not present

## 2016-06-21 DIAGNOSIS — R42 Dizziness and giddiness: Secondary | ICD-10-CM | POA: Diagnosis not present

## 2016-06-21 DIAGNOSIS — R1084 Generalized abdominal pain: Secondary | ICD-10-CM | POA: Diagnosis not present

## 2016-06-21 DIAGNOSIS — R55 Syncope and collapse: Secondary | ICD-10-CM | POA: Diagnosis not present

## 2016-06-21 DIAGNOSIS — R0789 Other chest pain: Secondary | ICD-10-CM | POA: Diagnosis not present

## 2016-06-21 DIAGNOSIS — R197 Diarrhea, unspecified: Secondary | ICD-10-CM | POA: Diagnosis not present

## 2016-06-21 DIAGNOSIS — R109 Unspecified abdominal pain: Secondary | ICD-10-CM | POA: Diagnosis not present

## 2016-06-21 DIAGNOSIS — R1110 Vomiting, unspecified: Secondary | ICD-10-CM | POA: Diagnosis not present

## 2016-06-21 DIAGNOSIS — E873 Alkalosis: Secondary | ICD-10-CM | POA: Diagnosis not present

## 2016-06-21 DIAGNOSIS — I714 Abdominal aortic aneurysm, without rupture: Secondary | ICD-10-CM | POA: Diagnosis not present

## 2016-06-22 DIAGNOSIS — R1084 Generalized abdominal pain: Secondary | ICD-10-CM | POA: Diagnosis not present

## 2016-06-22 DIAGNOSIS — R55 Syncope and collapse: Secondary | ICD-10-CM | POA: Diagnosis not present

## 2016-06-22 DIAGNOSIS — I714 Abdominal aortic aneurysm, without rupture: Secondary | ICD-10-CM | POA: Diagnosis not present

## 2016-06-22 DIAGNOSIS — R0789 Other chest pain: Secondary | ICD-10-CM | POA: Diagnosis not present

## 2016-06-22 DIAGNOSIS — E873 Alkalosis: Secondary | ICD-10-CM | POA: Diagnosis not present

## 2016-06-29 ENCOUNTER — Encounter: Payer: Medicare Other | Admitting: Adult Health

## 2016-06-29 ENCOUNTER — Encounter: Payer: Self-pay | Admitting: Adult Health

## 2016-06-29 DIAGNOSIS — I10 Essential (primary) hypertension: Secondary | ICD-10-CM | POA: Diagnosis not present

## 2016-06-29 DIAGNOSIS — E784 Other hyperlipidemia: Secondary | ICD-10-CM | POA: Diagnosis not present

## 2016-06-29 DIAGNOSIS — Z6823 Body mass index (BMI) 23.0-23.9, adult: Secondary | ICD-10-CM | POA: Diagnosis not present

## 2016-06-29 NOTE — Progress Notes (Deleted)
Cardiology Office Note   Date:  06/29/2016   ID:  Tanekia, Ryans [birth year], MRN [id number]  PCP:  Neale Burly, MD  Cardiologist: [place]/  Jory Sims, NP   No chief complaint on file.     History of Present Illness: Natalie Bridges is a 71 y.o. female who presents for ongoing assessment and management of bradycardia, after being seen on consultation by Dr. Domenic Polite, on 10/29/2015. The patient apparently had been admitted to [facility] with dizziness and had a heart rate of 39 bpm. On review of EKGs by Dr. Domenic Polite, ECG suggested some component of conduction system disease with incomplete right bundle branch block and left anterior vesicular block. An echocardiogram was ordered to assess LV function and for structural heart disease. A cardiac monitor was also placed for evaluation of heart rate.  Cardiac monitor results revealed that there was no slow heart rates lower than 50 bpm, no pauses, but explain syncope.  Echocardiogram 11/13/2015 Left ventricle: The cavity size was normal. Wall thickness was   increased in a pattern of mild LVH. Systolic function was normal.   The estimated ejection fraction was in the range of 55% to 60%.   Wall motion was normal; there were no regional wall motion   abnormalities. Left ventricular diastolic function parameters   were normal. - Aortic valve: Valve area (VTI): 2.29 cm^2. Valve area (Vmax):   2.54 cm^2. Valve area (Vmean): 2.27 cm^2. - Technically adequate study.  Past Medical History:  Diagnosis Date  . Anemia   . Bradycardia   . Essential hypertension   . History of stroke   . Hyperlipemia   . Pancytopenia ([place])   . Urinary incontinence     Past Surgical History:  Procedure Laterality Date  . HEMORRHOID SURGERY    . TUBAL LIGATION       Current Outpatient Prescriptions  Medication Sig Dispense Refill  . amLODipine (NORVASC) 5 MG tablet Take 5 mg by mouth daily.    Marland Kitchen aspirin 325 MG EC tablet Take 325 mg by  mouth daily.    Marland Kitchen atorvastatin (LIPITOR) 10 MG tablet Take 10 mg by mouth daily.    . ferrous sulfate 325 (65 FE) MG tablet Take 325 mg by mouth daily with breakfast.    . Omega-3 Fatty Acids (FISH OIL) 1000 MG CAPS Take 10,000 mg by mouth 3 (three) times daily.    Marland Kitchen oxybutynin (DITROPAN-XL) 5 MG 24 hr tablet Take 5 mg by mouth at bedtime.    . potassium chloride (K-DUR) 10 MEQ tablet Take 10 mEq by mouth daily.    . potassium chloride (MICRO-K) 10 MEQ CR capsule Take 10 mEq by mouth 2 (two) times daily.     No current facility-administered medications for this visit.     Allergies:   Codeine and Hydrocodone    Social History:  The patient  reports that she has never smoked. She has never used smokeless tobacco. She reports that she does not drink alcohol or use drugs.   Family History:  The patient's family history is not on file. She was adopted.    ROS: All other systems are reviewed and negative. Unless otherwise mentioned in H&P    PHYSICAL EXAM: VS:  LMP  (LMP Unknown)  , BMI There is no height or weight on file to calculate BMI. GEN: Well nourished, well developed, in no acute distress HEENT: normal Neck: no JVD, carotid bruits, or masses Cardiac: ***RRR; no murmurs, rubs, or gallops,no edema  Respiratory:  clear to auscultation bilaterally, normal work of breathing GI: soft, nontender, nondistended, + BS MS: no deformity or atrophy Skin: warm and dry, no rash Neuro:  Strength and sensation are intact Psych: euthymic mood, full affect   EKG:  EKG {ACTION; IS/IS [id number]} ordered today. The ekg ordered today demonstrates ***   Recent Labs: No results found for requested labs within last 8760 hours.    Lipid Panel No results found for: CHOL, TRIG, HDL, CHOLHDL, VLDL, LDLCALC, LDLDIRECT    Wt Readings from Last 3 Encounters:  10/29/15 168 lb (76.2 kg)      Other studies Reviewed: Additional studies/ records that were reviewed today include: ***. Review  of the above records demonstrates: ***   ASSESSMENT AND PLAN:  1.  ***   Current medicines are reviewed at length with the patient today.    Labs/ tests ordered today include: *** No orders of the defined types were placed in this encounter.    Disposition:   FU with   Signed, Jory Sims, NP  06/29/2016 7:25 AM    [facility] [address] [place], [place] [address] Phone: [phone]; Fax: [phone]

## 2016-07-06 ENCOUNTER — Encounter: Payer: Medicare Other | Admitting: Adult Health

## 2016-07-06 ENCOUNTER — Encounter: Payer: Self-pay | Admitting: *Deleted

## 2016-07-06 NOTE — Progress Notes (Deleted)
Cardiology Office Note   Date:  07/06/2016   ID:  Natalie Bridges, DOB Aug 17, 1945, MRN [id number]  PCP:  Neale Burly, MD  Cardiologist:  Domenic Polite, MD   No chief complaint on file.     History of Present Illness: Natalie Bridges is a 71 y.o. female who presents for posthospitalization follow-up after being seen in [place] for episode of syncope. Was last seen by cardiology on 10/29/2015, with a history of bradycardia. Patient also has a history of CVA was old right MCA territory infarct with no acute findings on recent CT. EKG revealed right bundle branch block, left anterior fascicular block with poor R-wave progression. At that time a 7 day cardiac event monitor was planned along with echocardiogram to assess cardiac structure and function.  Cardiac monitor dated 11/05/2015 revealed sinus bradycardia with probable sinus tachycardia noted or PVCs. Low heart rates in the 50s fast heart rates in the 140s with no pauses.  Echocardiogram dated 11/13/2015: Left ventricle: The cavity size was normal. Wall thickness was   increased in a pattern of mild LVH. Systolic function was normal.   The estimated ejection fraction was in the range of 55% to 60%.   Wall motion was normal; there were no regional wall motion   abnormalities. Left ventricular diastolic function parameters   were normal. - Aortic valve: Valve area (VTI): 2.29 cm^2. Valve area (Vmax):   2.54 cm^2. Valve area (Vmean): 2.27 cm^2. - Technically adequate study.    Past Medical History:  Diagnosis Date  . Anemia   . Bradycardia   . Essential hypertension   . History of stroke   . Hyperlipemia   . Pancytopenia ([place])   . Urinary incontinence     Past Surgical History:  Procedure Laterality Date  . HEMORRHOID SURGERY    . TUBAL LIGATION       Current Outpatient Prescriptions  Medication Sig Dispense Refill  . amLODipine (NORVASC) 5 MG tablet Take 5 mg by mouth daily.    Marland Kitchen aspirin 325 MG EC tablet Take 325 mg by  mouth daily.    Marland Kitchen atorvastatin (LIPITOR) 10 MG tablet Take 10 mg by mouth daily.    . ferrous sulfate 325 (65 FE) MG tablet Take 325 mg by mouth daily with breakfast.    . Omega-3 Fatty Acids (FISH OIL) 1000 MG CAPS Take 10,000 mg by mouth 3 (three) times daily.    Marland Kitchen oxybutynin (DITROPAN-XL) 5 MG 24 hr tablet Take 5 mg by mouth at bedtime.    . potassium chloride (K-DUR) 10 MEQ tablet Take 10 mEq by mouth daily.    . potassium chloride (MICRO-K) 10 MEQ CR capsule Take 10 mEq by mouth 2 (two) times daily.     No current facility-administered medications for this visit.     Allergies:   Codeine and Hydrocodone    Social History:  The patient  reports that she has never smoked. She has never used smokeless tobacco. She reports that she does not drink alcohol or use drugs.   Family History:  The patient's family history is not on file. She was adopted.    ROS: All other systems are reviewed and negative. Unless otherwise mentioned in H&P    PHYSICAL EXAM: VS:  LMP  (LMP Unknown)  , BMI There is no height or weight on file to calculate BMI. GEN: Well nourished, well developed, in no acute distress  HEENT: normal  Neck: no JVD, carotid bruits, or masses Cardiac: ***RRR; no murmurs, rubs,  or gallops,no edema  Respiratory:  clear to auscultation bilaterally, normal work of breathing GI: soft, nontender, nondistended, + BS MS: no deformity or atrophy  Skin: warm and dry, no rash Neuro:  Strength and sensation are intact Psych: euthymic mood, full affect   EKG:  EKG {ACTION; IS/IS [id number]} ordered today. The ekg ordered today demonstrates ***   Recent Labs: No results found for requested labs within last 8760 hours.    Lipid Panel No results found for: CHOL, TRIG, HDL, CHOLHDL, VLDL, LDLCALC, LDLDIRECT    Wt Readings from Last 3 Encounters:  10/29/15 168 lb (76.2 kg)      Other studies Reviewed: Additional studies/ records that were reviewed today include:  ***. Review of the above records demonstrates: ***   ASSESSMENT AND PLAN:  1.  ***   Current medicines are reviewed at length with the patient today.    Labs/ tests ordered today include: *** No orders of the defined types were placed in this encounter.    Disposition:   FU with   Signed, Jory Sims, NP  07/06/2016 7:05 AM    [facility] [address], [place], [place] [address] Phone: [phone]; Fax: [phone]

## 2016-12-19 DIAGNOSIS — S92425A Nondisplaced fracture of distal phalanx of left great toe, initial encounter for closed fracture: Secondary | ICD-10-CM | POA: Diagnosis not present

## 2016-12-19 DIAGNOSIS — I10 Essential (primary) hypertension: Secondary | ICD-10-CM | POA: Diagnosis not present

## 2016-12-19 DIAGNOSIS — E7800 Pure hypercholesterolemia, unspecified: Secondary | ICD-10-CM | POA: Diagnosis not present

## 2016-12-19 DIAGNOSIS — Z23 Encounter for immunization: Secondary | ICD-10-CM | POA: Diagnosis not present

## 2016-12-19 DIAGNOSIS — Z8673 Personal history of transient ischemic attack (TIA), and cerebral infarction without residual deficits: Secondary | ICD-10-CM | POA: Diagnosis not present

## 2016-12-19 DIAGNOSIS — W500XXA Accidental hit or strike by another person, initial encounter: Secondary | ICD-10-CM | POA: Diagnosis not present

## 2016-12-19 DIAGNOSIS — K219 Gastro-esophageal reflux disease without esophagitis: Secondary | ICD-10-CM | POA: Diagnosis not present

## 2016-12-19 DIAGNOSIS — Z79899 Other long term (current) drug therapy: Secondary | ICD-10-CM | POA: Diagnosis not present

## 2016-12-19 DIAGNOSIS — Z7982 Long term (current) use of aspirin: Secondary | ICD-10-CM | POA: Diagnosis not present

## 2016-12-19 DIAGNOSIS — S92532B Displaced fracture of distal phalanx of left lesser toe(s), initial encounter for open fracture: Secondary | ICD-10-CM | POA: Diagnosis not present

## 2016-12-19 DIAGNOSIS — S92512B Displaced fracture of proximal phalanx of left lesser toe(s), initial encounter for open fracture: Secondary | ICD-10-CM | POA: Diagnosis not present

## 2016-12-22 DIAGNOSIS — I10 Essential (primary) hypertension: Secondary | ICD-10-CM | POA: Diagnosis not present

## 2016-12-22 DIAGNOSIS — Z0000 Encounter for general adult medical examination without abnormal findings: Secondary | ICD-10-CM | POA: Diagnosis not present

## 2016-12-22 DIAGNOSIS — Z6823 Body mass index (BMI) 23.0-23.9, adult: Secondary | ICD-10-CM | POA: Diagnosis not present

## 2016-12-22 DIAGNOSIS — E7849 Other hyperlipidemia: Secondary | ICD-10-CM | POA: Diagnosis not present

## 2017-03-23 DIAGNOSIS — I10 Essential (primary) hypertension: Secondary | ICD-10-CM | POA: Diagnosis not present

## 2017-03-23 DIAGNOSIS — E7849 Other hyperlipidemia: Secondary | ICD-10-CM | POA: Diagnosis not present

## 2017-03-23 DIAGNOSIS — K210 Gastro-esophageal reflux disease with esophagitis: Secondary | ICD-10-CM | POA: Diagnosis not present

## 2017-03-26 DIAGNOSIS — E7849 Other hyperlipidemia: Secondary | ICD-10-CM | POA: Diagnosis not present

## 2017-03-26 DIAGNOSIS — K210 Gastro-esophageal reflux disease with esophagitis: Secondary | ICD-10-CM | POA: Diagnosis not present

## 2017-03-26 DIAGNOSIS — I10 Essential (primary) hypertension: Secondary | ICD-10-CM | POA: Diagnosis not present

## 2017-06-21 DIAGNOSIS — E7849 Other hyperlipidemia: Secondary | ICD-10-CM | POA: Diagnosis not present

## 2017-06-21 DIAGNOSIS — K210 Gastro-esophageal reflux disease with esophagitis: Secondary | ICD-10-CM | POA: Diagnosis not present

## 2017-06-21 DIAGNOSIS — Z0000 Encounter for general adult medical examination without abnormal findings: Secondary | ICD-10-CM | POA: Diagnosis not present

## 2017-06-21 DIAGNOSIS — Z1389 Encounter for screening for other disorder: Secondary | ICD-10-CM | POA: Diagnosis not present

## 2017-06-21 DIAGNOSIS — I10 Essential (primary) hypertension: Secondary | ICD-10-CM | POA: Diagnosis not present

## 2017-09-20 DIAGNOSIS — Z0000 Encounter for general adult medical examination without abnormal findings: Secondary | ICD-10-CM | POA: Diagnosis not present

## 2017-09-20 DIAGNOSIS — K210 Gastro-esophageal reflux disease with esophagitis: Secondary | ICD-10-CM | POA: Diagnosis not present

## 2017-09-20 DIAGNOSIS — E7849 Other hyperlipidemia: Secondary | ICD-10-CM | POA: Diagnosis not present

## 2017-09-20 DIAGNOSIS — I10 Essential (primary) hypertension: Secondary | ICD-10-CM | POA: Diagnosis not present

## 2018-02-15 DIAGNOSIS — E038 Other specified hypothyroidism: Secondary | ICD-10-CM | POA: Diagnosis not present

## 2018-02-15 DIAGNOSIS — R7303 Prediabetes: Secondary | ICD-10-CM | POA: Diagnosis not present

## 2018-02-15 DIAGNOSIS — E7849 Other hyperlipidemia: Secondary | ICD-10-CM | POA: Diagnosis not present

## 2018-02-15 DIAGNOSIS — I10 Essential (primary) hypertension: Secondary | ICD-10-CM | POA: Diagnosis not present

## 2018-02-15 DIAGNOSIS — Z0000 Encounter for general adult medical examination without abnormal findings: Secondary | ICD-10-CM | POA: Diagnosis not present

## 2018-02-15 DIAGNOSIS — K210 Gastro-esophageal reflux disease with esophagitis: Secondary | ICD-10-CM | POA: Diagnosis not present

## 2018-02-16 DIAGNOSIS — H2513 Age-related nuclear cataract, bilateral: Secondary | ICD-10-CM | POA: Diagnosis not present

## 2018-02-16 DIAGNOSIS — H43812 Vitreous degeneration, left eye: Secondary | ICD-10-CM | POA: Diagnosis not present

## 2018-03-07 DIAGNOSIS — H25813 Combined forms of age-related cataract, bilateral: Secondary | ICD-10-CM | POA: Diagnosis not present

## 2018-04-26 DIAGNOSIS — H25013 Cortical age-related cataract, bilateral: Secondary | ICD-10-CM | POA: Diagnosis not present

## 2018-04-26 DIAGNOSIS — H40013 Open angle with borderline findings, low risk, bilateral: Secondary | ICD-10-CM | POA: Diagnosis not present

## 2018-04-26 DIAGNOSIS — H18413 Arcus senilis, bilateral: Secondary | ICD-10-CM | POA: Diagnosis not present

## 2018-04-26 DIAGNOSIS — H2512 Age-related nuclear cataract, left eye: Secondary | ICD-10-CM | POA: Diagnosis not present

## 2018-04-26 DIAGNOSIS — H2513 Age-related nuclear cataract, bilateral: Secondary | ICD-10-CM | POA: Diagnosis not present

## 2018-08-05 DIAGNOSIS — H2511 Age-related nuclear cataract, right eye: Secondary | ICD-10-CM | POA: Diagnosis not present

## 2018-08-05 DIAGNOSIS — H2512 Age-related nuclear cataract, left eye: Secondary | ICD-10-CM | POA: Diagnosis not present

## 2018-08-29 DIAGNOSIS — H2511 Age-related nuclear cataract, right eye: Secondary | ICD-10-CM | POA: Diagnosis not present

## 2018-11-02 ENCOUNTER — Other Ambulatory Visit (HOSPITAL_COMMUNITY): Payer: Self-pay | Admitting: Internal Medicine

## 2018-11-02 DIAGNOSIS — Z1231 Encounter for screening mammogram for malignant neoplasm of breast: Secondary | ICD-10-CM

## 2018-11-15 DIAGNOSIS — Z1389 Encounter for screening for other disorder: Secondary | ICD-10-CM | POA: Diagnosis not present

## 2018-11-15 DIAGNOSIS — I10 Essential (primary) hypertension: Secondary | ICD-10-CM | POA: Diagnosis not present

## 2018-11-15 DIAGNOSIS — K210 Gastro-esophageal reflux disease with esophagitis: Secondary | ICD-10-CM | POA: Diagnosis not present

## 2018-11-15 DIAGNOSIS — Z0000 Encounter for general adult medical examination without abnormal findings: Secondary | ICD-10-CM | POA: Diagnosis not present

## 2018-11-15 DIAGNOSIS — E7849 Other hyperlipidemia: Secondary | ICD-10-CM | POA: Diagnosis not present

## 2018-11-15 DIAGNOSIS — E038 Other specified hypothyroidism: Secondary | ICD-10-CM | POA: Diagnosis not present

## 2018-11-30 ENCOUNTER — Ambulatory Visit (HOSPITAL_COMMUNITY): Payer: Medicare Other

## 2019-03-09 DIAGNOSIS — K2100 Gastro-esophageal reflux disease with esophagitis, without bleeding: Secondary | ICD-10-CM | POA: Diagnosis not present

## 2019-03-09 DIAGNOSIS — E7849 Other hyperlipidemia: Secondary | ICD-10-CM | POA: Diagnosis not present

## 2019-03-09 DIAGNOSIS — Z0000 Encounter for general adult medical examination without abnormal findings: Secondary | ICD-10-CM | POA: Diagnosis not present

## 2019-03-09 DIAGNOSIS — I10 Essential (primary) hypertension: Secondary | ICD-10-CM | POA: Diagnosis not present

## 2019-03-09 DIAGNOSIS — E038 Other specified hypothyroidism: Secondary | ICD-10-CM | POA: Diagnosis not present

## 2019-05-16 DIAGNOSIS — E038 Other specified hypothyroidism: Secondary | ICD-10-CM | POA: Diagnosis not present

## 2019-05-16 DIAGNOSIS — Z0000 Encounter for general adult medical examination without abnormal findings: Secondary | ICD-10-CM | POA: Diagnosis not present

## 2019-05-16 DIAGNOSIS — K2100 Gastro-esophageal reflux disease with esophagitis, without bleeding: Secondary | ICD-10-CM | POA: Diagnosis not present

## 2019-05-16 DIAGNOSIS — E7849 Other hyperlipidemia: Secondary | ICD-10-CM | POA: Diagnosis not present

## 2019-05-16 DIAGNOSIS — I10 Essential (primary) hypertension: Secondary | ICD-10-CM | POA: Diagnosis not present

## 2019-09-05 DIAGNOSIS — I10 Essential (primary) hypertension: Secondary | ICD-10-CM | POA: Diagnosis not present

## 2019-09-05 DIAGNOSIS — E7849 Other hyperlipidemia: Secondary | ICD-10-CM | POA: Diagnosis not present

## 2019-09-05 DIAGNOSIS — D649 Anemia, unspecified: Secondary | ICD-10-CM | POA: Diagnosis not present

## 2019-09-05 DIAGNOSIS — E038 Other specified hypothyroidism: Secondary | ICD-10-CM | POA: Diagnosis not present

## 2019-09-06 DIAGNOSIS — E7849 Other hyperlipidemia: Secondary | ICD-10-CM | POA: Diagnosis not present

## 2019-09-06 DIAGNOSIS — Z0000 Encounter for general adult medical examination without abnormal findings: Secondary | ICD-10-CM | POA: Diagnosis not present

## 2019-09-06 DIAGNOSIS — E038 Other specified hypothyroidism: Secondary | ICD-10-CM | POA: Diagnosis not present

## 2019-09-06 DIAGNOSIS — K2100 Gastro-esophageal reflux disease with esophagitis, without bleeding: Secondary | ICD-10-CM | POA: Diagnosis not present

## 2019-09-08 DIAGNOSIS — D649 Anemia, unspecified: Secondary | ICD-10-CM | POA: Diagnosis not present

## 2019-09-12 DIAGNOSIS — D649 Anemia, unspecified: Secondary | ICD-10-CM | POA: Diagnosis not present

## 2019-09-13 ENCOUNTER — Other Ambulatory Visit: Payer: Self-pay

## 2019-09-13 ENCOUNTER — Encounter: Payer: Self-pay | Admitting: Gastroenterology

## 2019-09-13 ENCOUNTER — Telehealth: Payer: Self-pay | Admitting: *Deleted

## 2019-09-13 ENCOUNTER — Ambulatory Visit (INDEPENDENT_AMBULATORY_CARE_PROVIDER_SITE_OTHER): Payer: Medicare Other | Admitting: Gastroenterology

## 2019-09-13 ENCOUNTER — Encounter: Payer: Self-pay | Admitting: *Deleted

## 2019-09-13 VITALS — BP 116/65 | HR 93 | Temp 97.0°F | Ht 64.0 in | Wt 189.2 lb

## 2019-09-13 DIAGNOSIS — R197 Diarrhea, unspecified: Secondary | ICD-10-CM

## 2019-09-13 DIAGNOSIS — D509 Iron deficiency anemia, unspecified: Secondary | ICD-10-CM

## 2019-09-13 DIAGNOSIS — D649 Anemia, unspecified: Secondary | ICD-10-CM | POA: Diagnosis not present

## 2019-09-13 DIAGNOSIS — R634 Abnormal weight loss: Secondary | ICD-10-CM

## 2019-09-13 NOTE — Progress Notes (Signed)
Primary Care Physician:  Neale Burly, MD  Primary Gastroenterologist:  Garfield Cornea, MD   Chief Complaint  Patient presents with  . Anemia  . Diarrhea    4-5 episodes per day x 1 month, light in color, no blood    HPI:  Natalie Bridges is a 74 y.o. female here for an urgent visit at the request of Dr.Hasanaj profound anemia.  Patient presents with daughter-in-law, Natalie Bridges, whom patient is residing with the past 1 year.  Patient has history of dementia and 1 year ago became unable to live alone.  She provides very little information today, information obtained solely from daughter-in-law.  Over the past 1 month she has had a decline in appetite, complains of nausea, and diarrhea.  Having 5-6 loose bowel movements daily sometimes associated with incontinence.  No melena or rectal bleeding.  Very picky eater now.  Consumes some chicken, very little red meat, sometimes fish.  Peas and potatoes but again eats very little.  Drinks a lot of [employer]. Patient has not complained of any abdominal pain.  She does have frequent belching.  She has been sleeping a lot, 10 to 12 hours a day.  Family recently became concerned because of her decline.  Noticed some dyspnea on exertion, seem to be stumbling and less steady on her feet.  She was seen by PCP, labs were obtained which showed hemoglobin of 4, MCV 60, white blood cell count 2000.  See below for further labs.  Patient received a total of 3 units of packed red blood cells, Friday and Tuesday.  Patient is on a baby aspirin daily.  She has a history of remote stroke.  Over 10 years ago she had surgery for what sounds like a perforated ulcer, this was the only other time she had received a blood transfusion.  She denies any NSAID use, aspirin powders.   H/o colon polyps in the past, they believe her last colonoscopy was around 2014.    Current Outpatient Medications  Medication Sig Dispense Refill  . amLODipine (NORVASC) 5 MG tablet  Take 5 mg by mouth daily.    Marland Kitchen aspirin 81 MG EC tablet Take 81 mg by mouth daily.     Marland Kitchen atorvastatin (LIPITOR) 10 MG tablet Take 10 mg by mouth daily.    Marland Kitchen donepezil (ARICEPT) 10 MG tablet Take 10 mg by mouth at bedtime.    Marland Kitchen levothyroxine (SYNTHROID) 25 MCG tablet Take 25 mcg by mouth daily before breakfast.    . Omega-3 Fatty Acids (FISH OIL) 1000 MG CAPS Take 1,000 mg by mouth daily.     . pantoprazole (PROTONIX) 40 MG tablet Take 40 mg by mouth daily.    . potassium chloride (K-DUR) 10 MEQ tablet Take 10 mEq by mouth daily.     No current facility-administered medications for this visit.    Allergies as of 09/13/2019 - Review Complete 09/13/2019  Allergen Reaction Noted  . Codeine Nausea And Vomiting 10/28/2015  . Hydrocodone Nausea And Vomiting 10/28/2015    Past Medical History:  Diagnosis Date  . Anemia   . Anxiety   . Bradycardia   . Dementia ([place])   . Essential hypertension   . History of stroke   . Hyperlipemia   . Hypothyroidism   . Pancytopenia ([place])   . Urinary incontinence     Past Surgical History:  Procedure Laterality Date  . COLONOSCOPY  2014  . HEMORRHOID SURGERY    . STOMACH SURGERY  2010?   ????  perforated ulcer  . TUBAL LIGATION      Family History  Adopted: Yes  Problem Relation Age of Onset  . Colon cancer Neg Hx   . Inflammatory bowel disease Neg Hx   . Celiac disease Neg Hx     Social History   Socioeconomic History  . Marital status: Widowed    Spouse name: Not on file  . Number of children: Not on file  . Years of education: Not on file  . Highest education level: Not on file  Occupational History  . Not on file  Tobacco Use  . Smoking status: Never Smoker  . Smokeless tobacco: Never Used  Substance and Sexual Activity  . Alcohol use: No  . Drug use: No  . Sexual activity: Not on file  Other Topics Concern  . Not on file  Social History Narrative   Widowed,lives by herself,has 1 child,works part time in [employer]    Social Determinants of Health   Financial Resource Strain:   . Difficulty of Paying Living Expenses:   Food Insecurity:   . Worried About Charity fundraiser in the Last Year:   . Arboriculturist in the Last Year:   Transportation Needs:   . Film/video editor (Medical):   Marland Kitchen Lack of Transportation (Non-Medical):   Physical Activity:   . Days of Exercise per Week:   . Minutes of Exercise per Session:   Stress:   . Feeling of Stress :   Social Connections:   . Frequency of Communication with Friends and Family:   . Frequency of Social Gatherings with Friends and Family:   . Attends Religious Services:   . Active Member of Clubs or Organizations:   . Attends Archivist Meetings:   Marland Kitchen Marital Status:   Intimate Partner Violence:   . Fear of Current or Ex-Partner:   . Emotionally Abused:   Marland Kitchen Physically Abused:   . Sexually Abused:       ROS: patient unavailable to provide, answers per POA, daughter in law [place]: Negative for anorexia,  fever, chills, fatigue, positive weakness.  Positive weight loss, unknown amount Eyes: Negative for vision changes.  ENT: Negative for hoarseness, difficulty swallowing , nasal congestion. CV: Negative for chest pain, angina, palpitations, positive dyspnea on exertion, positive peripheral edema.  Respiratory: Negative for dyspnea at rest, positive dyspnea on exertion, cough, sputum, wheezing.  GI: See history of present illness. GU:  Negative for dysuria, hematuria, urinary incontinence, urinary frequency, nocturnal urination.  MS: Negative for joint pain, low back pain.  Derm: Negative for rash or itching.  Neuro: Negative for weakness, abnormal sensation, seizure, frequent headaches, memory loss, confusion.  Psych: Negative for anxiety, depression, suicidal ideation, hallucinations.  Endo: Negative for unusual weight change.  See HPI Heme: Negative for bruising or bleeding. Allergy: Negative for rash or  hives.    Physical Examination:  BP 116/65   Pulse 93   Temp (!) 97 F (36.1 C) (Oral)   Ht 5' 4"  (1.626 m)   Wt 189 lb 3.2 oz (85.8 kg)   LMP  (LMP Unknown)   BMI 32.48 kg/m    General: Elderly female in no acute distress.  Accompanied by daughter-in-law, Natalie Bridges.  Patient is cooperative but does not answer any questions. Head: Normocephalic, atraumatic.   Eyes: Conjunctiva pink, no icterus. Mouth: Oropharyngeal mucosa moist and pink , no lesions erythema or exudate. Neck: Supple without thyromegaly, masses, or lymphadenopathy.  Lungs:  Clear to auscultation bilaterally.  Heart: Regular rate and rhythm, no murmurs rubs or gallops.  Abdomen: Bowel sounds are normal, nontender, nondistended, no hepatosplenomegaly or masses, no abdominal bruits or    hernia , no rebound or guarding.   Rectal: not performed Extremities: No lower extremity edema. No clubbing or deformities.  Neuro: Alert and oriented to self, grossly normal neurologically.  Skin: Warm and dry, no rash or jaundice.   Psych: Alert and cooperative, normal mood and affect.  Labs: 09/06/2019: White blood cell count 2000, hemoglobin 4, hematocrit 17.6, MCV 60, platelets 203,000, BUN 5, creatinine 0.91, albumin 3.1, total bilirubin 0.8, alk phos 59, AST 20, ALT 6, TSH 3.440, H. pylori IgA antibodies less than 9  Imaging Studies: No results found.  Impression/plan:  Very pleasant 74 year old female presenting today for urgent visit regarding symptomatic anemia.  Recent labs with profound microcytic anemia, hemoglobin of 4, MCV 60.  No overt GI bleeding.  Hemoccult status unknown.  Steady decline in oral intake, weight loss, nausea and diarrhea over the past 1 month.  No recent antibiotic use or ill contacts.  Possible remote perforated peptic ulcer disease requiring surgery and prior history of colon polyps.  Patient has received 3 units of packed red blood cells.  We need a posttransfusion hemoglobin.  Will obtain  labs today.  Will check iron, ferritin, screen for celiac.  Obtain stools for 4-week history of diarrhea.  Doubt we are dealing with infectious etiology given lack of ill contacts, recent antibiotic use, basically being homebound.    Would recommend urgent colonoscopy with possible upper endoscopy to evaluate profound microcytic anemia and above mentioned symptoms.  Malignancy needs to be excluded.  Plan for deep sedation. ASA II.  I have discussed the risks, alternatives, benefits with regards to but not limited to the risk of reaction to medication, bleeding, infection, perforation and the patient is agreeable to proceed. Written consent to be obtained.

## 2019-09-13 NOTE — Patient Instructions (Signed)
Natalie Bridges  09/13/2019     @PREFPERIOPPHARMACY @   Your procedure is scheduled on  09/18/2019  Report to [facility] at  [facility].  Call this number if you have problems the morning of surgery:  [phone]   Remember:  Follow the diet and prep instructions given to you by Dr Roseanne Kaufman office.                   Take these medicines the morning of surgery with A SIP OF WATER  Amlodipine, levothyroxine, protonix, risperdal.    Do not wear jewelry, make-up or nail polish.  Do not wear lotions, powders, or perfumes. Please wear deodorant and brush your teeth.  Do not shave 48 hours prior to surgery.  Men may shave face and neck.  Do not bring valuables to the hospital.  [facility] is not responsible for any belongings or valuables.  Contacts, dentures or bridgework may not be worn into surgery.  Leave your suitcase in the car.  After surgery it may be brought to your room.  For patients admitted to the hospital, discharge time will be determined by your treatment team.  Patients discharged the day of surgery will not be allowed to drive home.   Name and phone number of your driver:   family Special instructions:  DO NOT smoke the day of your procedure.  Please read over the following fact sheets that you were given. Anesthesia Post-op Instructions and Care and Recovery After Surgery       Upper Endoscopy, Adult, Care After This sheet gives you information about how to care for yourself after your procedure. Your health care provider may also give you more specific instructions. If you have problems or questions, contact your health care provider. What can I expect after the procedure? After the procedure, it is common to have:  A sore throat.  Mild stomach pain or discomfort.  Bloating.  Nausea. Follow these instructions at home:   Follow instructions from your health care provider about what to eat or drink after your procedure.  Return to your  normal activities as told by your health care provider. Ask your health care provider what activities are safe for you.  Take over-the-counter and prescription medicines only as told by your health care provider.  Do not drive for 24 hours if you were given a sedative during your procedure.  Keep all follow-up visits as told by your health care provider. This is important. Contact a health care provider if you have:  A sore throat that lasts longer than one day.  Trouble swallowing. Get help right away if:  You vomit blood or your vomit looks like coffee grounds.  You have: ? A fever. ? Bloody, black, or tarry stools. ? A severe sore throat or you cannot swallow. ? Difficulty breathing. ? Severe pain in your chest or abdomen. Summary  After the procedure, it is common to have a sore throat, mild stomach discomfort, bloating, and nausea.  Do not drive for 24 hours if you were given a sedative during the procedure.  Follow instructions from your health care provider about what to eat or drink after your procedure.  Return to your normal activities as told by your health care provider. This information is not intended to replace advice given to you by your health care provider. Make sure you discuss any questions you have with your health care provider. Document Revised: 08/17/2017  Document Reviewed: 07/26/2017 Elsevier Patient Education  [place].  Colonoscopy, Adult, Care After This sheet gives you information about how to care for yourself after your procedure. Your health care provider may also give you more specific instructions. If you have problems or questions, contact your health care provider. What can I expect after the procedure? After the procedure, it is common to have:  A small amount of blood in your stool for 24 hours after the procedure.  Some gas.  Mild cramping or bloating of your abdomen. Follow these instructions at home: Eating and  drinking   Drink enough fluid to keep your urine pale yellow.  Follow instructions from your health care provider about eating or drinking restrictions.  Resume your normal diet as instructed by your health care provider. Avoid heavy or fried foods that are hard to digest. Activity  Rest as told by your health care provider.  Avoid sitting for a long time without moving. Get up to take short walks every 1-2 hours. This is important to improve blood flow and breathing. Ask for help if you feel weak or unsteady.  Return to your normal activities as told by your health care provider. Ask your health care provider what activities are safe for you. Managing cramping and bloating   Try walking around when you have cramps or feel bloated.  Apply heat to your abdomen as told by your health care provider. Use the heat source that your health care provider recommends, such as a moist heat pack or a heating pad. ? Place a towel between your skin and the heat source. ? Leave the heat on for 20-30 minutes. ? Remove the heat if your skin turns bright red. This is especially important if you are unable to feel pain, heat, or cold. You may have a greater risk of getting burned. General instructions  For the first 24 hours after the procedure: ? Do not drive or use machinery. ? Do not sign important documents. ? Do not drink alcohol. ? Do your regular daily activities at a slower pace than normal. ? Eat soft foods that are easy to digest.  Take over-the-counter and prescription medicines only as told by your health care provider.  Keep all follow-up visits as told by your health care provider. This is important. Contact a health care provider if:  You have blood in your stool 2-3 days after the procedure. Get help right away if you have:  More than a small spotting of blood in your stool.  Large blood clots in your stool.  Swelling of your abdomen.  Nausea or vomiting.  A  fever.  Increasing pain in your abdomen that is not relieved with medicine. Summary  After the procedure, it is common to have a small amount of blood in your stool. You may also have mild cramping and bloating of your abdomen.  For the first 24 hours after the procedure, do not drive or use machinery, sign important documents, or drink alcohol.  Get help right away if you have a lot of blood in your stool, nausea or vomiting, a fever, or increased pain in your abdomen. This information is not intended to replace advice given to you by your health care provider. Make sure you discuss any questions you have with your health care provider. Document Revised: 09/19/2018 Document Reviewed: 09/19/2018 Elsevier Patient Education  [place] After These instructions provide you with information about caring for yourself after your  procedure. Your health care provider may also give you more specific instructions. Your treatment has been planned according to current medical practices, but problems sometimes occur. Call your health care provider if you have any problems or questions after your procedure. What can I expect after the procedure? After your procedure, you may:  Feel sleepy for several hours.  Feel clumsy and have poor balance for several hours.  Feel forgetful about what happened after the procedure.  Have poor judgment for several hours.  Feel nauseous or vomit.  Have a sore throat if you had a breathing tube during the procedure. Follow these instructions at home: For at least 24 hours after the procedure:      Have a responsible adult stay with you. It is important to have someone help care for you until you are awake and alert.  Rest as needed.  Do not: ? Participate in activities in which you could fall or become injured. ? Drive. ? Use heavy machinery. ? Drink alcohol. ? Take sleeping pills or medicines that cause  drowsiness. ? Make important decisions or sign legal documents. ? Take care of children on your own. Eating and drinking  Follow the diet that is recommended by your health care provider.  If you vomit, drink water, juice, or soup when you can drink without vomiting.  Make sure you have little or no nausea before eating solid foods. General instructions  Take over-the-counter and prescription medicines only as told by your health care provider.  If you have sleep apnea, surgery and certain medicines can increase your risk for breathing problems. Follow instructions from your health care provider about wearing your sleep device: ? Anytime you are sleeping, including during daytime naps. ? While taking prescription pain medicines, sleeping medicines, or medicines that make you drowsy.  If you smoke, do not smoke without supervision.  Keep all follow-up visits as told by your health care provider. This is important. Contact a health care provider if:  You keep feeling nauseous or you keep vomiting.  You feel light-headed.  You develop a rash.  You have a fever. Get help right away if:  You have trouble breathing. Summary  For several hours after your procedure, you may feel sleepy and have poor judgment.  Have a responsible adult stay with you for at least 24 hours or until you are awake and alert. This information is not intended to replace advice given to you by your health care provider. Make sure you discuss any questions you have with your health care provider. Document Revised: 05/24/2017 Document Reviewed: 06/16/2015 Elsevier Patient Education  [place].

## 2019-09-13 NOTE — H&P (View-Only) (Signed)
Primary Care Physician:  Neale Burly, MD  Primary Gastroenterologist:  Garfield Cornea, MD   Chief Complaint  Patient presents with  . Anemia  . Diarrhea    4-5 episodes per day x 1 month, light in color, no blood    HPI:  Natalie Bridges is a 74 y.o. female here for an urgent visit at the request of Dr.Hasanaj profound anemia.  Patient presents with daughter-in-law, Melisia Leming, whom patient is residing with the past 1 year.  Patient has history of dementia and 1 year ago became unable to live alone.  She provides very little information today, information obtained solely from daughter-in-law.  Over the past 1 month she has had a decline in appetite, complains of nausea, and diarrhea.  Having 5-6 loose bowel movements daily sometimes associated with incontinence.  No melena or rectal bleeding.  Very picky eater now.  Consumes some chicken, very little red meat, sometimes fish.  Peas and potatoes but again eats very little.  Drinks a lot of [employer]. Patient has not complained of any abdominal pain.  She does have frequent belching.  She has been sleeping a lot, 10 to 12 hours a day.  Family recently became concerned because of her decline.  Noticed some dyspnea on exertion, seem to be stumbling and less steady on her feet.  She was seen by PCP, labs were obtained which showed hemoglobin of 4, MCV 60, white blood cell count 2000.  See below for further labs.  Patient received a total of 3 units of packed red blood cells, Friday and Tuesday.  Patient is on a baby aspirin daily.  She has a history of remote stroke.  Over 10 years ago she had surgery for what sounds like a perforated ulcer, this was the only other time she had received a blood transfusion.  She denies any NSAID use, aspirin powders.   H/o colon polyps in the past, they believe her last colonoscopy was around 2014.    Current Outpatient Medications  Medication Sig Dispense Refill  . amLODipine (NORVASC) 5 MG tablet  Take 5 mg by mouth daily.    Marland Kitchen aspirin 81 MG EC tablet Take 81 mg by mouth daily.     Marland Kitchen atorvastatin (LIPITOR) 10 MG tablet Take 10 mg by mouth daily.    Marland Kitchen donepezil (ARICEPT) 10 MG tablet Take 10 mg by mouth at bedtime.    Marland Kitchen levothyroxine (SYNTHROID) 25 MCG tablet Take 25 mcg by mouth daily before breakfast.    . Omega-3 Fatty Acids (FISH OIL) 1000 MG CAPS Take 1,000 mg by mouth daily.     . pantoprazole (PROTONIX) 40 MG tablet Take 40 mg by mouth daily.    . potassium chloride (K-DUR) 10 MEQ tablet Take 10 mEq by mouth daily.     No current facility-administered medications for this visit.    Allergies as of 09/13/2019 - Review Complete 09/13/2019  Allergen Reaction Noted  . Codeine Nausea And Vomiting 10/28/2015  . Hydrocodone Nausea And Vomiting 10/28/2015    Past Medical History:  Diagnosis Date  . Anemia   . Anxiety   . Bradycardia   . Dementia ([place])   . Essential hypertension   . History of stroke   . Hyperlipemia   . Hypothyroidism   . Pancytopenia ()   . Urinary incontinence     Past Surgical History:  Procedure Laterality Date  . COLONOSCOPY  2014  . HEMORRHOID SURGERY    . STOMACH SURGERY  2010?   ????  perforated ulcer  . TUBAL LIGATION      Family History  Adopted: Yes  Problem Relation Age of Onset  . Colon cancer Neg Hx   . Inflammatory bowel disease Neg Hx   . Celiac disease Neg Hx     Social History   Socioeconomic History  . Marital status: Widowed    Spouse name: Not on file  . Number of children: Not on file  . Years of education: Not on file  . Highest education level: Not on file  Occupational History  . Not on file  Tobacco Use  . Smoking status: Never Smoker  . Smokeless tobacco: Never Used  Substance and Sexual Activity  . Alcohol use: No  . Drug use: No  . Sexual activity: Not on file  Other Topics Concern  . Not on file  Social History Narrative   Widowed,lives by herself,has 1 child,works part time in [employer]    Social Determinants of Health   Financial Resource Strain:   . Difficulty of Paying Living Expenses:   Food Insecurity:   . Worried About Charity fundraiser in the Last Year:   . Arboriculturist in the Last Year:   Transportation Needs:   . Film/video editor (Medical):   Marland Kitchen Lack of Transportation (Non-Medical):   Physical Activity:   . Days of Exercise per Week:   . Minutes of Exercise per Session:   Stress:   . Feeling of Stress :   Social Connections:   . Frequency of Communication with Friends and Family:   . Frequency of Social Gatherings with Friends and Family:   . Attends Religious Services:   . Active Member of Clubs or Organizations:   . Attends Archivist Meetings:   Marland Kitchen Marital Status:   Intimate Partner Violence:   . Fear of Current or Ex-Partner:   . Emotionally Abused:   Marland Kitchen Physically Abused:   . Sexually Abused:       ROS: patient unavailable to provide, answers per POA, daughter in law Bradley: Negative for anorexia,  fever, chills, fatigue, positive weakness.  Positive weight loss, unknown amount Eyes: Negative for vision changes.  ENT: Negative for hoarseness, difficulty swallowing , nasal congestion. CV: Negative for chest pain, angina, palpitations, positive dyspnea on exertion, positive peripheral edema.  Respiratory: Negative for dyspnea at rest, positive dyspnea on exertion, cough, sputum, wheezing.  GI: See history of present illness. GU:  Negative for dysuria, hematuria, urinary incontinence, urinary frequency, nocturnal urination.  MS: Negative for joint pain, low back pain.  Derm: Negative for rash or itching.  Neuro: Negative for weakness, abnormal sensation, seizure, frequent headaches, memory loss, confusion.  Psych: Negative for anxiety, depression, suicidal ideation, hallucinations.  Endo: Negative for unusual weight change.  See HPI Heme: Negative for bruising or bleeding. Allergy: Negative for rash or  hives.    Physical Examination:  BP 116/65   Pulse 93   Temp (!) 97 F (36.1 C) (Oral)   Ht 5' 4"  (1.626 m)   Wt 189 lb 3.2 oz (85.8 kg)   LMP  (LMP Unknown)   BMI 32.48 kg/m    General: Elderly female in no acute distress.  Accompanied by daughter-in-law, Myalynn Lingle.  Patient is cooperative but does not answer any questions. Head: Normocephalic, atraumatic.   Eyes: Conjunctiva pink, no icterus. Mouth: Oropharyngeal mucosa moist and pink , no lesions erythema or exudate. Neck: Supple without thyromegaly, masses, or lymphadenopathy.  Lungs:  Clear to auscultation bilaterally.  Heart: Regular rate and rhythm, no murmurs rubs or gallops.  Abdomen: Bowel sounds are normal, nontender, nondistended, no hepatosplenomegaly or masses, no abdominal bruits or    hernia , no rebound or guarding.   Rectal: not performed Extremities: No lower extremity edema. No clubbing or deformities.  Neuro: Alert and oriented to self, grossly normal neurologically.  Skin: Warm and dry, no rash or jaundice.   Psych: Alert and cooperative, normal mood and affect.  Labs: 09/06/2019: White blood cell count 2000, hemoglobin 4, hematocrit 17.6, MCV 60, platelets 203,000, BUN 5, creatinine 0.91, albumin 3.1, total bilirubin 0.8, alk phos 59, AST 20, ALT 6, TSH 3.440, H. pylori IgA antibodies less than 9  Imaging Studies: No results found.  Impression/plan:  Very pleasant 74 year old female presenting today for urgent visit regarding symptomatic anemia.  Recent labs with profound microcytic anemia, hemoglobin of 4, MCV 60.  No overt GI bleeding.  Hemoccult status unknown.  Steady decline in oral intake, weight loss, nausea and diarrhea over the past 1 month.  No recent antibiotic use or ill contacts.  Possible remote perforated peptic ulcer disease requiring surgery and prior history of colon polyps.  Patient has received 3 units of packed red blood cells.  We need a posttransfusion hemoglobin.  Will obtain  labs today.  Will check iron, ferritin, screen for celiac.  Obtain stools for 4-week history of diarrhea.  Doubt we are dealing with infectious etiology given lack of ill contacts, recent antibiotic use, basically being homebound.    Would recommend urgent colonoscopy with possible upper endoscopy to evaluate profound microcytic anemia and above mentioned symptoms.  Malignancy needs to be excluded.  Plan for deep sedation. ASA II.  I have discussed the risks, alternatives, benefits with regards to but not limited to the risk of reaction to medication, bleeding, infection, perforation and the patient is agreeable to proceed. Written consent to be obtained.

## 2019-09-13 NOTE — Patient Instructions (Signed)
1. Please go to [facility] lab today and get blood work. Pick up specimen container and try to collect stool as soon as you can. 2. Colonoscopy with possible upper endoscopy as scheduled. See separate instructions.  3. If you feel dizzy, lightheaded, have worsening shortness of breath, pass black stools, go to the emergency room right away.

## 2019-09-13 NOTE — Telephone Encounter (Signed)
PA for EGD approved via [facility] website. Auth# [id number] dates 09/18/2019-12/17/2019

## 2019-09-13 NOTE — Telephone Encounter (Signed)
Called spoke with patient daughter. Aware on procedure is scheduled for 09/18/2019.

## 2019-09-14 ENCOUNTER — Other Ambulatory Visit: Payer: Self-pay

## 2019-09-14 ENCOUNTER — Other Ambulatory Visit: Payer: Self-pay | Admitting: Gastroenterology

## 2019-09-14 ENCOUNTER — Other Ambulatory Visit (HOSPITAL_COMMUNITY)
Admission: RE | Admit: 2019-09-14 | Discharge: 2019-09-14 | Disposition: A | Discharge status: Home / Self Care | Payer: Medicare Other | Source: Ambulatory Visit | Attending: Internal Medicine | Admitting: Internal Medicine

## 2019-09-14 ENCOUNTER — Encounter (HOSPITAL_COMMUNITY)
Admission: RE | Admit: 2019-09-14 | Discharge: 2019-09-14 | Disposition: A | Discharge status: Home / Self Care | Payer: Medicare Other | Source: Ambulatory Visit | Attending: Internal Medicine | Admitting: Internal Medicine

## 2019-09-14 ENCOUNTER — Encounter (HOSPITAL_COMMUNITY): Payer: Self-pay

## 2019-09-14 DIAGNOSIS — I10 Essential (primary) hypertension: Secondary | ICD-10-CM | POA: Insufficient documentation

## 2019-09-14 DIAGNOSIS — Z01818 Encounter for other preprocedural examination: Secondary | ICD-10-CM | POA: Diagnosis not present

## 2019-09-14 DIAGNOSIS — Z20822 Contact with and (suspected) exposure to covid-19: Secondary | ICD-10-CM | POA: Insufficient documentation

## 2019-09-14 LAB — CBC WITH DIFFERENTIAL/PLATELET
Absolute Monocytes: 265 cells/uL (ref 200–950)
Basophils Absolute: 40 cells/uL (ref 0–200)
Basophils Relative: 1.9 %
Eosinophils Absolute: 103 cells/uL (ref 15–500)
Eosinophils Relative: 4.9 %
HCT: 31.8 % — ABNORMAL LOW (ref 35.0–45.0)
Hemoglobin: 8.8 g/dL — ABNORMAL LOW (ref 11.7–15.5)
Lymphs Abs: 592 cells/uL — ABNORMAL LOW (ref 850–3900)
MCH: 19.8 pg — ABNORMAL LOW (ref 27.0–33.0)
MCHC: 27.7 g/dL — ABNORMAL LOW (ref 32.0–36.0)
MCV: 71.6 fL — ABNORMAL LOW (ref 80.0–100.0)
MPV: 9.3 fL (ref 7.5–12.5)
Monocytes Relative: 12.6 %
Neutro Abs: 1100 cells/uL — ABNORMAL LOW (ref 1500–7800)
Neutrophils Relative %: 52.4 %
Platelets: 201 10*3/uL (ref 140–400)
RBC: 4.44 10*6/uL (ref 3.80–5.10)
RDW: 30.2 % — ABNORMAL HIGH (ref 11.0–15.0)
Total Lymphocyte: 28.2 %
WBC: 2.1 10*3/uL — ABNORMAL LOW (ref 3.8–10.8)

## 2019-09-14 LAB — TEST AUTHORIZATION

## 2019-09-14 LAB — BASIC METABOLIC PANEL
BUN/Creatinine Ratio: 5 (calc) — ABNORMAL LOW (ref 6–22)
BUN: 5 mg/dL — ABNORMAL LOW (ref 7–25)
CO2: 28 mmol/L (ref 20–32)
Calcium: 8.5 mg/dL — ABNORMAL LOW (ref 8.6–10.4)
Chloride: 105 mmol/L (ref 98–110)
Creat: 0.92 mg/dL (ref 0.60–0.93)
Glucose, Bld: 112 mg/dL (ref 65–139)
Potassium: 2.9 mmol/L — ABNORMAL LOW (ref 3.5–5.3)
Sodium: 140 mmol/L (ref 135–146)

## 2019-09-14 LAB — IRON,TIBC AND FERRITIN PANEL
%SAT: 4 % (calc) — ABNORMAL LOW (ref 16–45)
Ferritin: 6 ng/mL — ABNORMAL LOW (ref 16–288)
Iron: 15 ug/dL — ABNORMAL LOW (ref 45–160)
TIBC: 348 mcg/dL (calc) (ref 250–450)

## 2019-09-14 LAB — CBC MORPHOLOGY

## 2019-09-14 LAB — MAGNESIUM: Magnesium: 1.8 mg/dL (ref 1.5–2.5)

## 2019-09-14 LAB — SARS CORONAVIRUS 2 (TAT 6-24 HRS): SARS Coronavirus 2: NEGATIVE

## 2019-09-14 LAB — IGA: Immunoglobulin A: 120 mg/dL (ref 70–320)

## 2019-09-14 LAB — TISSUE TRANSGLUTAMINASE, IGA: (tTG) Ab, IgA: 1 U/mL

## 2019-09-14 MED ORDER — POTASSIUM CHLORIDE ER 20 MEQ PO TBCR: 20.0000 meq | EXTENDED_RELEASE_TABLET | Freq: Two times a day (BID) | ORAL | 0 refills | Status: DC

## 2019-09-15 ENCOUNTER — Telehealth: Payer: Self-pay | Admitting: Internal Medicine

## 2019-09-15 NOTE — Telephone Encounter (Signed)
LSL, pts daughter in law is worried about Cdiff stool testing. Pts procedure with [facility] is on Monday and she isn't sure if it has to be done before the procedure. Pt also messed the first stool container up by urinating in it due to having Alzheimer's. Please advise

## 2019-09-15 NOTE — Telephone Encounter (Signed)
[phone] please call patient daughter in law [place], she is concerned that she will not be able to get a stool sample from the patient because she has alzheimers

## 2019-09-15 NOTE — Telephone Encounter (Signed)
Spoke with LSL and if pt isn't able to complete stool testing prior to pts EDG/TCS on Monday, it's ok if it's not completed by Monday. If stool testing is needed from [facility], it may be able to be collected during the procedure. Pts daughter in law is aware and understands communication.

## 2019-09-15 NOTE — Telephone Encounter (Signed)
Noted  

## 2019-09-18 ENCOUNTER — Other Ambulatory Visit: Payer: Self-pay

## 2019-09-18 ENCOUNTER — Ambulatory Visit (HOSPITAL_COMMUNITY)
Admission: RE | Admit: 2019-09-18 | Discharge: 2019-09-18 | Disposition: A | Discharge status: Home / Self Care | Payer: Medicare Other | Attending: Internal Medicine | Admitting: Internal Medicine

## 2019-09-18 ENCOUNTER — Ambulatory Visit (HOSPITAL_COMMUNITY): Payer: Medicare Other | Admitting: Anesthesiology

## 2019-09-18 ENCOUNTER — Encounter (HOSPITAL_COMMUNITY)
Admission: RE | Disposition: A | Discharge status: Home / Self Care | Payer: Self-pay | Source: Home / Self Care | Attending: Internal Medicine

## 2019-09-18 ENCOUNTER — Encounter (HOSPITAL_COMMUNITY): Payer: Self-pay | Admitting: Internal Medicine

## 2019-09-18 DIAGNOSIS — K319 Disease of stomach and duodenum, unspecified: Secondary | ICD-10-CM | POA: Diagnosis not present

## 2019-09-18 DIAGNOSIS — F0390 Unspecified dementia without behavioral disturbance: Secondary | ICD-10-CM | POA: Diagnosis not present

## 2019-09-18 DIAGNOSIS — Z885 Allergy status to narcotic agent status: Secondary | ICD-10-CM | POA: Insufficient documentation

## 2019-09-18 DIAGNOSIS — E039 Hypothyroidism, unspecified: Secondary | ICD-10-CM | POA: Insufficient documentation

## 2019-09-18 DIAGNOSIS — K635 Polyp of colon: Secondary | ICD-10-CM

## 2019-09-18 DIAGNOSIS — K529 Noninfective gastroenteritis and colitis, unspecified: Secondary | ICD-10-CM | POA: Diagnosis not present

## 2019-09-18 DIAGNOSIS — K3189 Other diseases of stomach and duodenum: Secondary | ICD-10-CM | POA: Diagnosis not present

## 2019-09-18 DIAGNOSIS — Z7982 Long term (current) use of aspirin: Secondary | ICD-10-CM | POA: Insufficient documentation

## 2019-09-18 DIAGNOSIS — D122 Benign neoplasm of ascending colon: Secondary | ICD-10-CM | POA: Insufficient documentation

## 2019-09-18 DIAGNOSIS — R197 Diarrhea, unspecified: Secondary | ICD-10-CM | POA: Diagnosis not present

## 2019-09-18 DIAGNOSIS — Z79899 Other long term (current) drug therapy: Secondary | ICD-10-CM | POA: Diagnosis not present

## 2019-09-18 DIAGNOSIS — I10 Essential (primary) hypertension: Secondary | ICD-10-CM | POA: Diagnosis not present

## 2019-09-18 DIAGNOSIS — D125 Benign neoplasm of sigmoid colon: Secondary | ICD-10-CM | POA: Insufficient documentation

## 2019-09-18 DIAGNOSIS — I8500 Esophageal varices without bleeding: Secondary | ICD-10-CM | POA: Insufficient documentation

## 2019-09-18 DIAGNOSIS — D123 Benign neoplasm of transverse colon: Secondary | ICD-10-CM | POA: Diagnosis not present

## 2019-09-18 DIAGNOSIS — E785 Hyperlipidemia, unspecified: Secondary | ICD-10-CM | POA: Diagnosis not present

## 2019-09-18 DIAGNOSIS — D509 Iron deficiency anemia, unspecified: Secondary | ICD-10-CM | POA: Diagnosis not present

## 2019-09-18 DIAGNOSIS — Z8673 Personal history of transient ischemic attack (TIA), and cerebral infarction without residual deficits: Secondary | ICD-10-CM | POA: Insufficient documentation

## 2019-09-18 DIAGNOSIS — D124 Benign neoplasm of descending colon: Secondary | ICD-10-CM | POA: Diagnosis not present

## 2019-09-18 HISTORY — PX: ESOPHAGOGASTRODUODENOSCOPY: SHX1529

## 2019-09-18 HISTORY — PX: COLONOSCOPY: SHX174

## 2019-09-18 LAB — C DIFFICILE QUICK SCREEN W PCR REFLEX
C Diff antigen: NEGATIVE
C Diff interpretation: NOT DETECTED
C Diff toxin: NEGATIVE

## 2019-09-18 SURGERY — COLONOSCOPY WITH PROPOFOL
Anesthesia: General

## 2019-09-18 MED ORDER — PHENYLEPHRINE HCL (PRESSORS) 10 MG/ML IV SOLN
INTRAVENOUS | Status: AC
  Filled 2019-09-18: qty 1

## 2019-09-18 MED ORDER — GLYCOPYRROLATE 0.2 MG/ML IJ SOLN
0.2000 mg | Freq: Once | INTRAMUSCULAR | Status: AC
  Administered 2019-09-18: 0.2 mg via INTRAVENOUS
  Filled 2019-09-18: qty 1

## 2019-09-18 MED ORDER — LACTATED RINGERS IV SOLN
INTRAVENOUS | Status: DC | PRN
Start: 2019-09-18 — End: 2019-09-18

## 2019-09-18 MED ORDER — PROPOFOL 10 MG/ML IV BOLUS
INTRAVENOUS | Status: AC
  Filled 2019-09-18: qty 20

## 2019-09-18 MED ORDER — LACTATED RINGERS IV SOLN
INTRAVENOUS | Status: DC
  Administered 2019-09-18: 1000 mL via INTRAVENOUS

## 2019-09-18 MED ORDER — PROPOFOL 500 MG/50ML IV EMUL
INTRAVENOUS | Status: DC | PRN
  Administered 2019-09-18: 150 ug/kg/min via INTRAVENOUS

## 2019-09-18 MED ORDER — LIDOCAINE VISCOUS HCL 2 % MT SOLN
OROMUCOSAL | Status: AC
  Filled 2019-09-18: qty 15

## 2019-09-18 MED ORDER — CHLORHEXIDINE GLUCONATE CLOTH 2 % EX PADS: 6.0000 | MEDICATED_PAD | Freq: Once | CUTANEOUS | Status: DC

## 2019-09-18 MED ORDER — LIDOCAINE VISCOUS HCL 2 % MT SOLN: 15.0000 mL | Freq: Once | OROMUCOSAL | Status: DC

## 2019-09-18 MED ORDER — PHENYLEPHRINE HCL (PRESSORS) 10 MG/ML IV SOLN
INTRAVENOUS | Status: DC | PRN
  Administered 2019-09-18 (×3): 100 ug via INTRAVENOUS

## 2019-09-18 NOTE — Discharge Instructions (Signed)
Colonoscopy Discharge Instructions  Read the instructions outlined below and refer to this sheet in the next few weeks. These discharge instructions provide you with general information on caring for yourself after you leave the hospital. Your doctor may also give you specific instructions. While your treatment has been planned according to the most current medical practices available, unavoidable complications occasionally occur. If you have any problems or questions after discharge, call Dr. Gala Romney at [phone]. ACTIVITY  You may resume your regular activity, but move at a slower pace for the next 24 hours.   Take frequent rest periods for the next 24 hours.   Walking will help get rid of the air and reduce the bloated feeling in your belly (abdomen).   No driving for 24 hours (because of the medicine (anesthesia) used during the test).    Do not sign any important legal documents or operate any machinery for 24 hours (because of the anesthesia used during the test).  NUTRITION  Drink plenty of fluids.   You may resume your normal diet as instructed by your doctor.   Begin with a light meal and progress to your normal diet. Heavy or fried foods are harder to digest and may make you feel sick to your stomach (nauseated).   Avoid alcoholic beverages for 24 hours or as instructed.  MEDICATIONS  You may resume your normal medications unless your doctor tells you otherwise.  WHAT YOU CAN EXPECT TODAY  Some feelings of bloating in the abdomen.   Passage of more gas than usual.   Spotting of blood in your stool or on the toilet paper.  IF YOU HAD POLYPS REMOVED DURING THE COLONOSCOPY:  No aspirin products for 7 days or as instructed.   No alcohol for 7 days or as instructed.   Eat a soft diet for the next 24 hours.  FINDING OUT THE RESULTS OF YOUR TEST Not all test results are available during your visit. If your test results are not back during the visit, make an appointment  with your caregiver to find out the results. Do not assume everything is normal if you have not heard from your caregiver or the medical facility. It is important for you to follow up on all of your test results.  SEEK IMMEDIATE MEDICAL ATTENTION IF:  You have more than a spotting of blood in your stool.   Your belly is swollen (abdominal distention).   You are nauseated or vomiting.   You have a temperature over 101.   You have abdominal pain or discomfort that is severe or gets worse throughout the day.    EGD Discharge instructions Please read the instructions outlined below and refer to this sheet in the next few weeks. These discharge instructions provide you with general information on caring for yourself after you leave the hospital. Your doctor may also give you specific instructions. While your treatment has been planned according to the most current medical practices available, unavoidable complications occasionally occur. If you have any problems or questions after discharge, please call your doctor. ACTIVITY  You may resume your regular activity but move at a slower pace for the next 24 hours.   Take frequent rest periods for the next 24 hours.   Walking will help expel (get rid of) the air and reduce the bloated feeling in your abdomen.   No driving for 24 hours (because of the anesthesia (medicine) used during the test).   You may shower.   Do not sign  any important legal documents or operate any machinery for 24 hours (because of the anesthesia used during the test).  NUTRITION  Drink plenty of fluids.   You may resume your normal diet.   Begin with a light meal and progress to your normal diet.   Avoid alcoholic beverages for 24 hours or as instructed by your caregiver.  MEDICATIONS  You may resume your normal medications unless your caregiver tells you otherwise.  WHAT YOU CAN EXPECT TODAY  You may experience abdominal discomfort such as a feeling of  fullness or gas pains.  FOLLOW-UP  Your doctor will discuss the results of your test with you.  SEEK IMMEDIATE MEDICAL ATTENTION IF ANY OF THE FOLLOWING OCCUR:  Excessive nausea (feeling sick to your stomach) and/or vomiting.   Severe abdominal pain and distention (swelling).   Trouble swallowing.   Temperature over 101 F (37.8 C).   Rectal bleeding or vomiting of blood.    Polyps removed from your colon today (2).  Stomach inflamed-biopsies taken  Further recommendations to follow pending review of pathology report  Office visit with [place] (LSL) 6 weeks.  At patient request, I called Melissa Uphoff at [phone] and reviewed results

## 2019-09-18 NOTE — Interval H&P Note (Signed)
History and Physical Interval Note:  09/18/2019 10:53 AM  Natalie Bridges  has presented today for surgery, with the diagnosis of anemia, weight loss, diarrhea.  The various methods of treatment have been discussed with the patient and family. After consideration of risks, benefits and other options for treatment, the patient has consented to  Procedure(s) with comments: COLONOSCOPY WITH PROPOFOL (N/A) - 10:15am ESOPHAGOGASTRODUODENOSCOPY (EGD) WITH PROPOFOL (N/A) as a surgical intervention.  The patient's history has been reviewed, patient examined, no change in status, stable for surgery.  I have reviewed the patient's chart and labs.  Questions were answered to the patient's satisfaction.     Harish Bram  No change.  Iron deficiency anemia noted.  Hypokalemia-treated.  Stool studies not accomplished. -Post 3 unit transfusion.  Here for colonoscopy with possible EGD per plan.  The risks, benefits, limitations, imponderables and alternatives regarding both EGD and colonoscopy have been reviewed with the patient. Questions have been answered. All parties agreeable.

## 2019-09-18 NOTE — Op Note (Signed)
[facility] Patient Name: Natalie Bridges Procedure Date: 09/18/2019 10:49 AM MRN: [id number] Date of Birth: 1945/08/24 Attending MD: Norvel Richards , MD CSN: [id number] Age: 74 Admit Type: Outpatient Procedure:                Upper GI endoscopy Indications:              Iron deficiency anemia Providers:                Norvel Richards, MD, Otis Peak B. Sharon Seller, RN,                            Nelma Rothman, Technician Referring MD:              Medicines:                Propofol per Anesthesia Complications:            No immediate complications. Estimated Blood Loss:     Estimated blood loss was minimal. Procedure:                After obtaining informed consent, the endoscope was                            passed under direct vision. Throughout the                            procedure, the patient's blood pressure, pulse, and                            oxygen saturations were monitored continuously. The                            [id number]-H190 ([id number]) scope was introduced through the                            mouth, and advanced to the antrum of the stomach. 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 Disorgamized area of vascular ectatic       lesion seen in the antrum. No typical portal gastropathy; no ulcer or       infiltrating process. Patient did have scattered erosions with tiny       white pustules involving the gastric mucosa of uncertain significance.       Gastric biopsies taken. Impression:               -Esophageal varices. Possible early G [address]. inflamed                            abnormal appearing gastric mucosa of uncertain                            significance?"status post biopsy Moderate Sedation:      Moderate (conscious) sedation was personally administered by an        anesthesia professional. The following parameters  were monitored: oxygen       saturation, heart rate, blood pressure, respiratory rate, EKG, adequacy       of pulmonary ventilation, and response to care. Recommendation:           - Patient has a contact number available for                            emergencies. The signs and symptoms of potential                            delayed complications were discussed with the                            patient. Return to normal activities tomorrow.                            Written discharge instructions were provided to the                            patient.                           - Advance diet as tolerated. Follow-up on                            pathology. See colonoscopy report. Procedure Code(s):        --- Professional ---                           [phone], 52, Esophagogastroduodenoscopy, flexible,                            transoral; diagnostic, including collection of                            specimen(s) by brushing or washing, when performed                            (separate procedure) Diagnosis Code(s):        --- Professional ---                           D50.9, Iron deficiency anemia, unspecified CPT copyright 2019 American Medical Association. All rights reserved. The codes documented in this report are preliminary and upon coder review may  be revised to meet current compliance requirements. Cristopher Estimable. Jerald Hennington, MD Norvel Richards, MD 09/18/2019 1:32:05 PM This report has been signed electronically. Number of Addenda: 0

## 2019-09-18 NOTE — Interval H&P Note (Signed)
History and Physical Interval Note:  09/18/2019 10:57 AM  Natalie Bridges  has presented today for surgery, with the diagnosis of anemia, weight loss, diarrhea.  The various methods of treatment have been discussed with the patient and family. After consideration of risks, benefits and other options for treatment, the patient has consented to  Procedure(s) with comments: COLONOSCOPY WITH PROPOFOL (N/A) - 10:15am ESOPHAGOGASTRODUODENOSCOPY (EGD) WITH PROPOFOL (N/A) as a surgical intervention.  The patient's history has been reviewed, patient examined, no change in status, stable for surgery.  I have reviewed the patient's chart and labs.  Questions were answered to the patient's satisfaction.     Natalie Bridges  Hypokalemia addressed.  Status post 3 unit transfusion.  Iron deficiency confirmed.  Celiac markers negative.  Stool study not yet submitted.  No dysphagia  Colonoscopy today with possible EGD to follow.  Further recommendations to follow.  The risks, benefits, limitations, imponderables and alternatives regarding both EGD and colonoscopy have been reviewed with the patient. Questions have been answered. All parties agreeable.

## 2019-09-18 NOTE — Op Note (Addendum)
[facility] Patient Name: Natalie Bridges Procedure Date: 09/18/2019 10:57 AM MRN: [id number] Date of Birth: [birth year] Attending MD: Norvel Richards , MD CSN: [id number] Age: 74 Admit Type: Outpatient Procedure:                Colonoscopy Indications:              Chronic diarrhea, Iron deficiency anemia Providers:                Norvel Richards, MD, Gwenlyn Fudge RN, RN,                            Nelma Rothman, Technician Referring MD:              Medicines:                Propofol per Anesthesia Complications:            No immediate complications. Estimated Blood Loss:     Estimated blood loss was minimal. Procedure:                Pre-Anesthesia Assessment:                           - Prior to the procedure, a History and Physical                            was performed, and patient medications and                            allergies were reviewed. The patient's tolerance of                            previous anesthesia was also reviewed. The risks                            and benefits of the procedure and the sedation                            options and risks were discussed with the patient.                            All questions were answered, and informed consent                            was obtained. Prior Anticoagulants: The patient has                            taken no previous anticoagulant or antiplatelet                            agents. ASA Grade Assessment: III - A patient with                            severe systemic disease. After reviewing the risks  and benefits, the patient was deemed in                            satisfactory condition to undergo the procedure.                           After obtaining informed consent, the colonoscope                            was passed under direct vision. Throughout the                            procedure, the patient's blood pressure, pulse, and                             oxygen saturations were monitored continuously. The                            CF-[id number] ([id number]) scope was introduced through                            the anus and advanced to the the cecum, identified                            by appendiceal orifice and ileocecal valve. The                            colonoscopy was performed without difficulty. The                            patient tolerated the procedure well. The quality                            of the bowel preparation was adequate. Scope In: 11:06:54 AM Scope Out: 11:23:10 AM Scope Withdrawal Time: 0 hours 10 minutes 25 seconds  Total Procedure Duration: 0 hours 16 minutes 16 seconds  Findings:      The perianal and digital rectal examinations were normal.      A 12 mm polyp was found in the splenic flexure. The polyp was sessile.       The polyp was removed with a hot snare. Resection and retrieval were       complete. Estimated blood loss: none.      A 6 mm polyp was found in the sigmoid colon. The polyp was       semi-pedunculated. The polyp was removed with a cold snare. Resection       and retrieval were complete. Estimated blood loss was minimal.      Segmental biopsies of the right and left colon taken to further evaluate       diarrhea. Also, stool specimen was submitted for GIP/C. difficile       testing. Rectal mucosa seen well on?face. Rectal vault too small to       retroflex. Impression:               - One 12 mm polyp at the splenic flexure, removed  with a hot snare. Resected and retrieved. Status                            post segmental biopsy and stool sampling.                           - One 6 mm polyp in the sigmoid colon, removed with                            a cold snare. Resected and retrieved. Moderate Sedation:      Moderate (conscious) sedation was personally administered by an       anesthesia professional. The following parameters were monitored: oxygen        saturation, heart rate, blood pressure, respiratory rate, EKG, adequacy       of pulmonary ventilation, and response to care. Recommendation:           - Patient has a contact number available for                            emergencies. The signs and symptoms of potential                            delayed complications were discussed with the                            patient. Return to normal activities tomorrow.                            Written discharge instructions were provided to the                            patient.                           - Advance diet as tolerated. Follow-up on                            pathology. See EGD report Procedure Code(s):        --- Professional ---                           [phone], Colonoscopy, flexible; with removal of                            tumor(s), polyp(s), or other lesion(s) by snare                            technique Diagnosis Code(s):        --- Professional ---                           K63.5, Polyp of colon                           K52.9, Noninfective gastroenteritis and colitis,  unspecified                           D50.9, Iron deficiency anemia, unspecified CPT copyright 2019 American Medical Association. All rights reserved. The codes documented in this report are preliminary and upon coder review may  be revised to meet current compliance requirements. Cristopher Estimable. Tauna Macfarlane, MD Norvel Richards, MD 09/18/2019 11:27:03 AM This report has been signed electronically. Number of Addenda: 0

## 2019-09-18 NOTE — Anesthesia Preprocedure Evaluation (Signed)
Anesthesia Evaluation  Patient identified by MRN, date of birth, ID band Patient awake    Reviewed: Allergy & Precautions, H&P , NPO status , Patient's Chart, lab work & pertinent test results, reviewed documented beta blocker date and time   Airway Mallampati: I  TM Distance: >3 FB Neck ROM: full    Dental  (+) Edentulous Upper, Edentulous Lower   Pulmonary neg pulmonary ROS,    Pulmonary exam normal breath sounds clear to auscultation       Cardiovascular Exercise Tolerance: Good hypertension, negative cardio ROS   Rhythm:regular Rate:Normal     Neuro/Psych PSYCHIATRIC DISORDERS Anxiety Dementia negative neurological ROS     GI/Hepatic negative GI ROS, Neg liver ROS,   Endo/Other  Hypothyroidism   Renal/GU negative Renal ROS  negative genitourinary   Musculoskeletal   Abdominal   Peds  Hematology  (+) Blood dyscrasia, anemia ,   Anesthesia Other Findings   Reproductive/Obstetrics negative OB ROS                             Anesthesia Physical Anesthesia Plan  ASA: III  Anesthesia Plan: General   Post-op Pain Management:    Induction:   PONV Risk Score and Plan: Propofol infusion  Airway Management Planned:   Additional Equipment:   Intra-op Plan:   Post-operative Plan:   Informed Consent: I have reviewed the patients History and Physical, chart, labs and discussed the procedure including the risks, benefits and alternatives for the proposed anesthesia with the patient or authorized representative who has indicated his/her understanding and acceptance.     Dental Advisory Given  Plan Discussed with: CRNA  Anesthesia Plan Comments:         Anesthesia Quick Evaluation

## 2019-09-18 NOTE — Transfer of Care (Signed)
Immediate Anesthesia Transfer of Care Note  Patient: Natalie Bridges  Procedure(s) Performed: COLONOSCOPY WITH PROPOFOL (N/A ) ESOPHAGOGASTRODUODENOSCOPY (EGD) WITH PROPOFOL (N/A ) BIOPSY POLYPECTOMY  Patient Location: PACU  Anesthesia Type:General  Level of Consciousness: awake, alert  and patient cooperative  Airway & Oxygen Therapy: Patient Spontanous Breathing  Post-op Assessment: Report given to RN, Post -op Vital signs reviewed and stable and Patient moving all extremities X 4  Post vital signs: Reviewed and stable  Last Vitals:  Vitals Value Taken Time  BP    Temp    Pulse 75 09/18/19 1148  Resp 21 09/18/19 1148  SpO2 95 % 09/18/19 1148  Vitals shown include unvalidated device data.  Last Pain:  Vitals:   09/18/19 1102  TempSrc:   PainSc: 0-No pain      Patients Stated Pain Goal: 5 (59/96/89 5702)  Complications: No complications documented.

## 2019-09-18 NOTE — Anesthesia Postprocedure Evaluation (Signed)
Anesthesia Post Note  Patient: Aaniyah Strohm  Procedure(s) Performed: COLONOSCOPY WITH PROPOFOL (N/A ) ESOPHAGOGASTRODUODENOSCOPY (EGD) WITH PROPOFOL (N/A ) BIOPSY POLYPECTOMY  Patient location during evaluation: PACU Anesthesia Type: General Level of consciousness: awake and patient cooperative Pain management: satisfactory to patient Vital Signs Assessment: post-procedure vital signs reviewed and stable Respiratory status: spontaneous breathing, respiratory function stable and nonlabored ventilation Cardiovascular status: stable Postop Assessment: no apparent nausea or vomiting Anesthetic complications: no   No complications documented.   Last Vitals:  Vitals:   09/18/19 0932  BP: 104/62  Pulse: 62  Resp: (!) 22  Temp: 36.4 C  SpO2: 99%    Last Pain:  Vitals:   09/18/19 1102  TempSrc:   PainSc: 0-No pain                 Anabelle Bungert

## 2019-09-19 ENCOUNTER — Other Ambulatory Visit: Payer: Self-pay

## 2019-09-19 LAB — SURGICAL PATHOLOGY

## 2019-09-20 LAB — GASTROINTESTINAL PANEL BY PCR, STOOL (REPLACES STOOL CULTURE)

## 2019-09-22 ENCOUNTER — Other Ambulatory Visit: Payer: Self-pay

## 2019-09-22 DIAGNOSIS — D649 Anemia, unspecified: Secondary | ICD-10-CM

## 2019-10-02 ENCOUNTER — Encounter: Payer: Self-pay | Admitting: Internal Medicine

## 2019-10-26 ENCOUNTER — Other Ambulatory Visit: Payer: Self-pay

## 2019-10-26 DIAGNOSIS — D509 Iron deficiency anemia, unspecified: Secondary | ICD-10-CM

## 2019-11-08 ENCOUNTER — Ambulatory Visit: Payer: Medicare Other | Admitting: Gastroenterology

## 2019-11-08 NOTE — Progress Notes (Deleted)
      Primary Care Physician: Neale Burly, MD  Primary Gastroenterologist:    No chief complaint on file.   HPI: Natalie Bridges is a 74 y.o. female here  Current Outpatient Medications  Medication Sig Dispense Refill  . amLODipine (NORVASC) 5 MG tablet Take 5 mg by mouth daily.    Marland Kitchen aspirin 81 MG EC tablet Take 81 mg by mouth daily.     Marland Kitchen atorvastatin (LIPITOR) 10 MG tablet Take 10 mg by mouth daily.    Marland Kitchen donepezil (ARICEPT) 10 MG tablet Take 10 mg by mouth at bedtime.    Marland Kitchen levothyroxine (SYNTHROID) 25 MCG tablet Take 25 mcg by mouth daily before breakfast.    . Omega-3 Fatty Acids (FISH OIL) 1000 MG CAPS Take 1,000 mg by mouth daily.     . pantoprazole (PROTONIX) 40 MG tablet Take 40 mg by mouth daily.    . Potassium Chloride ER 20 MEQ TBCR Take 20 mEq by mouth in the morning and at bedtime. Please note this is an increased dosage. Once this rx is complete, resume your previous 73meq daily. 10 tablet 0  . risperiDONE (RISPERDAL) 0.5 MG tablet Take 0.5 mg by mouth daily.     No current facility-administered medications for this visit.    Allergies as of 11/08/2019 - Review Complete 09/18/2019  Allergen Reaction Noted  . Codeine Nausea And Vomiting 10/28/2015  . Hydrocodone Nausea And Vomiting 10/28/2015    ROS:  General: Negative for anorexia, weight loss, fever, chills, fatigue, weakness. ENT: Negative for hoarseness, difficulty swallowing , nasal congestion. CV: Negative for chest pain, angina, palpitations, dyspnea on exertion, peripheral edema.  Respiratory: Negative for dyspnea at rest, dyspnea on exertion, cough, sputum, wheezing.  GI: See history of present illness. GU:  Negative for dysuria, hematuria, urinary incontinence, urinary frequency, nocturnal urination.  Endo: Negative for unusual weight change.    Physical Examination:   LMP  (LMP Unknown)   General: Well-nourished, well-developed in no acute distress.  Eyes: No icterus. Mouth: Oropharyngeal  mucosa moist and pink , no lesions erythema or exudate. Lungs: Clear to auscultation bilaterally.  Heart: Regular rate and rhythm, no murmurs rubs or gallops.  Abdomen: Bowel sounds are normal, nontender, nondistended, no hepatosplenomegaly or masses, no abdominal bruits or hernia , no rebound or guarding.   Extremities: No lower extremity edema. No clubbing or deformities. Neuro: Alert and oriented x 4   Skin: Warm and dry, no jaundice.   Psych: Alert and cooperative, normal mood and affect.  Labs:  ***  Imaging Studies: No results found.

## 2019-11-10 ENCOUNTER — Ambulatory Visit: Payer: Medicare Other | Admitting: Gastroenterology

## 2019-11-10 ENCOUNTER — Telehealth: Payer: Self-pay

## 2019-11-10 NOTE — Telephone Encounter (Signed)
noted 

## 2019-11-10 NOTE — Telephone Encounter (Signed)
FYI Pt's daughter cancelled pts apt this morning. Pt has alzheimers and was very afraid this morning and her daughter wasn't able to get pt together. Apt was scheduled for today 11/10/19 @ 10:30 AM. Pt has been r/s with Walden Field, NP.

## 2019-11-15 ENCOUNTER — Ambulatory Visit: Payer: Medicare Other | Admitting: Nurse Practitioner

## 2019-11-15 NOTE — Progress Notes (Deleted)
Referring Provider: Neale Burly, MD Primary Care Physician:  Neale Burly, MD Primary GI:  Dr. Gala Romney  No chief complaint on file.   HPI:   Natalie Bridges is a 74 y.o. female who presents for follow-up.  Patient was seen in our office 09/13/2019 for microcytic anemia, diarrhea, weight loss.  Noted history of dementia and resides with her daughter.  Noted 1 month decline in appetite with nausea and diarrhea with 5-6 loose stools a day and incontinence.  No overt abdominal pain complaints.  Sleeping a lot, up to 10 to 12 hours a day.  Some dyspnea on exertion, stumbling.  Family concerned about her decline.  At one point recently PCP found a hemoglobin of 4, MCV 60 for which she received 3 units of packed red blood cells.  Query surgery for what sounds to be a perforated ulcer over 10 years ago.  No NSAID use.  Previous colonoscopy 2014 with history of colon polyps.  Recommended check posttransfusion labs as well as iron studies, celiac disease screening.  Doubt infectious etiology.  Recommended urgent colonoscopy with possible EGD to evaluate for causes of anemia.  Stool studies do not appear to been completed.  Celiac screen negative.  Hemoglobin stable/improved at 8.8, microcytic and hypochromic.  Potassium quite low at 2.9.  Add on magnesium normal.  Iron low at 15, saturation low at 4%, ferritin low at 6.  Recommended increase potassium to 20 M EQ twice daily until procedure on 09/18/2019.  Recommend recheck labs prior to follow-up office visit.  Colonoscopy completed 09/18/2019 which found a 12 mm polyp of the splenic flexure, 6 mm polyp in the sigmoid colon.  Surgical pathology found the polyps to be tubular adenoma, random colon biopsies to be without significant pathological findings..  Recommended repeat colonoscopy in 3 years if overall health permits.  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  Surgical pathology found the gastric biopsy to be gastric antral type mucosa with reactive gastropathy, intestinal metaplasia, negative for dysplasia, negative for H. Pylori.   Incidentally review of CT of the abdomen and pelvis completed at [facility] [address] 06/21/2016 noted varices surrounding the distal esophagus.  Hepatobiliary commented limited to status post cholecystectomy, minimal intrahepatic biliary ductal dilation probable postcholecystectomy, no CBD dilation.  No mention of parenchymal or border evaluation.  Incidentally most recent LFTs on CMP completed in June 2021 were normal.  Today she is accompanied by her daughter.  Today she states she is doing okay overall.  Past Medical History:  Diagnosis Date   Anemia    Anxiety    Bradycardia    Dementia ([place])    Essential hypertension    History of stroke    Hyperlipemia    Hypothyroidism    Pancytopenia ([place])    Urinary incontinence     Past Surgical History:  Procedure Laterality Date   COLONOSCOPY  2014   HEMORRHOID SURGERY     STOMACH SURGERY  2010?   ????perforated ulcer   TUBAL LIGATION      Current Outpatient Medications  Medication Sig Dispense Refill   amLODipine (NORVASC) 5 MG tablet Take 5 mg by mouth daily.     aspirin 81 MG EC tablet Take 81 mg by mouth daily.      atorvastatin (LIPITOR) 10 MG tablet Take 10 mg by mouth daily.     donepezil (ARICEPT)  10 MG tablet Take 10 mg by mouth at bedtime.     levothyroxine (SYNTHROID) 25 MCG tablet Take 25 mcg by mouth daily before breakfast.     Omega-3 Fatty Acids (FISH OIL) 1000 MG CAPS Take 1,000 mg by mouth daily.      pantoprazole (PROTONIX) 40 MG tablet Take 40 mg by mouth daily.     Potassium Chloride ER 20 MEQ TBCR Take 20 mEq by mouth in the morning and at bedtime. Please note this is an increased dosage. Once this rx is complete, resume your previous 80meq daily. 10 tablet  0   risperiDONE (RISPERDAL) 0.5 MG tablet Take 0.5 mg by mouth daily.     No current facility-administered medications for this visit.    Allergies as of 11/15/2019 - Review Complete 09/18/2019  Allergen Reaction Noted   Codeine Nausea And Vomiting 10/28/2015   Hydrocodone Nausea And Vomiting 10/28/2015    Family History  Adopted: Yes  Problem Relation Age of Onset   Colon cancer Neg Hx    Inflammatory bowel disease Neg Hx    Celiac disease Neg Hx     Social History   Socioeconomic History   Marital status: Widowed    Spouse name: Not on file   Number of children: Not on file   Years of education: Not on file   Highest education level: Not on file  Occupational History   Not on file  Tobacco Use   Smoking status: Never Smoker   Smokeless tobacco: Never Used  Vaping Use   Vaping Use: Never used  Substance and Sexual Activity   Alcohol use: No   Drug use: No   Sexual activity: Not on file  Other Topics Concern   Not on file  Social History Narrative   Widowed,lives by herself,has 1 child,works part time in restaurant   Social Determinants of Health   Financial Resource Strain:    Difficulty of Paying Living Expenses: Not on file  Food Insecurity:    Worried About Charity fundraiser in the Last Year: Not on file   [employer] of Food in the Last Year: Not on file  Transportation Needs:    Lack of Transportation (Medical): Not on file   Lack of Transportation (Non-Medical): Not on file  Physical Activity:    Days of Exercise per Week: Not on file   Minutes of Exercise per Session: Not on file  Stress:    Feeling of Stress : Not on file  Social Connections:    Frequency of Communication with Friends and Family: Not on file   Frequency of Social Gatherings with Friends and Family: Not on file   Attends Religious Services: Not on file   Active Member of Clubs or Organizations: Not on file   Attends Archivist Meetings: Not  on file   Marital Status: Not on file    Subjective: Review of Systems  Constitutional: Negative for chills, fever, malaise/fatigue and weight loss.  HENT: Negative for congestion and sore throat.   Respiratory: Negative for cough and shortness of breath.   Cardiovascular: Negative for chest pain and palpitations.  Gastrointestinal: Negative for abdominal pain, blood in stool, diarrhea, melena, nausea and vomiting.  Musculoskeletal: Negative for joint pain and myalgias.  Skin: Negative for rash.  Neurological: Negative for dizziness and weakness.  Endo/Heme/Allergies: Does not bruise/bleed easily.  Psychiatric/Behavioral: Negative for depression. The patient is not nervous/anxious.   All other systems reviewed and are negative.  Objective: LMP  (LMP Unknown)  Physical Exam Vitals and nursing note reviewed.  Constitutional:      General: She is not in acute distress.    Appearance: Normal appearance. She is well-developed. She is not ill-appearing, toxic-appearing or diaphoretic.  HENT:     Head: Normocephalic and atraumatic.     Nose: No congestion or rhinorrhea.  Eyes:     General: No scleral icterus. Cardiovascular:     Rate and Rhythm: Normal rate and regular rhythm.     Heart sounds: Normal heart sounds.  Pulmonary:     Effort: Pulmonary effort is normal. No respiratory distress.     Breath sounds: Normal breath sounds.  Abdominal:     General: Bowel sounds are normal.     Palpations: Abdomen is soft. There is no hepatomegaly, splenomegaly or mass.     Tenderness: There is no abdominal tenderness. There is no guarding or rebound.     Hernia: No hernia is present.  Skin:    General: Skin is warm and dry.     Coloration: Skin is not jaundiced.     Findings: No rash.  Neurological:     General: No focal deficit present.     Mental Status: She is alert and oriented to person, place, and time.  Psychiatric:        Attention and Perception: Attention normal.         Mood and Affect: Mood normal.        Speech: Speech normal.        Behavior: Behavior normal.        Thought Content: Thought content normal.        Cognition and Memory: Cognition and memory normal.      Assessment:  ***   Plan: ***    Thank you for allowing [place] to participate in the care of [place], DNP, AGNP-C Adult & Gerontological Nurse Practitioner [facility]   11/15/2019 7:55 AM   Disclaimer: This note was dictated with voice recognition software. Similar sounding words can inadvertently be transcribed and may not be corrected upon review.

## 2019-12-13 DIAGNOSIS — E7849 Other hyperlipidemia: Secondary | ICD-10-CM | POA: Diagnosis not present

## 2019-12-13 DIAGNOSIS — Z0000 Encounter for general adult medical examination without abnormal findings: Secondary | ICD-10-CM | POA: Diagnosis not present

## 2019-12-13 DIAGNOSIS — E038 Other specified hypothyroidism: Secondary | ICD-10-CM | POA: Diagnosis not present

## 2019-12-13 DIAGNOSIS — I10 Essential (primary) hypertension: Secondary | ICD-10-CM | POA: Diagnosis not present

## 2019-12-13 DIAGNOSIS — D649 Anemia, unspecified: Secondary | ICD-10-CM | POA: Diagnosis not present

## 2019-12-13 DIAGNOSIS — K2100 Gastro-esophageal reflux disease with esophagitis, without bleeding: Secondary | ICD-10-CM | POA: Diagnosis not present

## 2019-12-15 DIAGNOSIS — D649 Anemia, unspecified: Secondary | ICD-10-CM | POA: Diagnosis not present

## 2019-12-18 DIAGNOSIS — D649 Anemia, unspecified: Secondary | ICD-10-CM | POA: Diagnosis not present

## 2019-12-21 NOTE — H&P (View-Only) (Signed)
Referring Provider: Neale Burly, MD Primary Care Physician:  Neale Burly, MD Primary GI Physician: Dr. Gala Romney  Chief Complaint  Patient presents with  . Low hemoglobin    yellow stools, loose stools    HPI:   Natalie Bridges is a 74 y.o. female presenting today at the request of Hasanaj, Samul Dada, MD for profound anemia.  Last seen in our office 09/13/2019 also at the request of Dr. Sherrie Sport for profound anemia.  Due to patient's dementia, information obtained with solely from daughter-in-law, Emmer Lillibridge.  Reported 1 month of decline in appetite, nausea, and diarrhea with 5-6 BMs daily with some incontinence.  No BRBPR or melena.  A lot of belching but patient had not complained of abdominal pain.  She had been evaluated by her PCP who found a hemoglobin of 4, MCV 60, WBC 2000.  She received 3 units PRBCs.  She was on daily baby aspirin.  History of remote stroke.  Surgery due to ?possible perforated ulcer in 2010.  Plan to recheck labs, iron panel, screen for celiac disease, stool studies for infectious etiology, and TCS/possible EGD.  Labs completed 09/13/2019: Hemoglobin improved to 8.8, MCV 71.6, MCH 19.8, MCHC 27.7.  Iron 15, saturation 4%, ferritin 6.  Creatinine 0.92, potassium 2.9.  Celiac screen negative.  She was advised to take 20 mEq of potassium twice daily x5 days then resume normal dose (10 mEq daily).  Procedure 09/18/2019: Colonoscopy: 12 mm polyp at the splenic flexure (tubular adenoma), 6 mm polyp in sigmoid colon (tubular adenoma), segmental biopsies were taken and stool sample submitted for testing.  Random colon biopsies were negative for microscopic colitis.  C. difficile and GI pathogen panel were negative.  Recommended repeat colonoscopy in 3 years if health permits.  EGD: 3 columns of grade 2-3 esophageal varices, disorganized area of vascular ectatic lesion in the antrum, no portal gastropathy, ulcer, or infiltrating process.  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  Scattered  erosions with tiny white pustules involving the gastric mucosa of uncertain significance s/p biopsy.  Gastric biopsy with reactive gastropathy, intestinal metaplasia negative for dysplasia, negative for H. Pylori.  Patient had labs completed 12/15/2019 again revealing hemoglobin of 4.6.  She received 3 units PRBCs.  Today: All information was obtained from daughter-in-law today.  Patient does not contribute to conversation due to history of dementia.  Down 7 lbs in the last 3 months.  Not interested in food.  Weighed about 192 lbs 1 year ago. No nausea or vomiting. Very gassy. This is chronic. No typical GERD symptoms that we know of. Taking Protonix daily. No abdominal pain.   2-3 BMs daily. Yellow. Stools are watery. Nocturnal BMs about 3-4 times a week. No fried/fatty/greasy foods. No regular dairy products.  Diarrhea started about 6 months or so ago.   Daughter-in-law feels dementia is progressing pretty quickly.  She has some stool seepage at times.  She has urinary incontinence.  Anemia: No BRBPR or melena. No blood in the urine, vaginal bleeding, or nose bleeds. No significant bruising.  No trouble with swallowing pills. Not taking baby aspirin. No other NSAIDs.   Esophageal varices: No known history of cirrhosis. Never drank alcohol. No history of drug use. 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 No blood transfusions in the past until recently. Has swelling in her ankles. No abdominal swelling. No yellowing of eyes or skin. BP is usually in the low 100s/low 60s.  She has not taken her blood pressure  medication this morning.  Past Medical History:  Diagnosis Date  . Anemia   . Anxiety   . Bradycardia   . Dementia ([place])   . Essential hypertension   . History of stroke    x2  . Hyperlipemia   . Hypothyroidism   . Pancytopenia ([place])   . Urinary incontinence     Past Surgical History:  Procedure Laterality Date  . COLONOSCOPY  2014  . COLONOSCOPY  09/18/2019     Dr. Gala Romney; 12 mm tubular adenoma, 6 mm tubular adenoma, random colon biopsies negative for microscopic colitis, C. difficile and GI pathogen panel negative.  Repeat colonoscopy in 3 years if health permits.  . ESOPHAGOGASTRODUODENOSCOPY  09/18/2019   Dr. Gala Romney; 3 columns of grade 2-3 esophageal varices, disorganized area vascular ectatic lesion in the antrum, no portal gastropathy, ulcer, or infiltrating process.  Possible gave.  Scattered erosions with tiny white pustules involving gastric mucosa s/p biopsy revealing reactive gastropathy, intestinal metaplasia, no dysplasia or H. pylori.  Marland Kitchen HEMORRHOID SURGERY    . STOMACH SURGERY  2010?   ????perforated ulcer  . TUBAL LIGATION      Current Outpatient Medications  Medication Sig Dispense Refill  . amLODipine (NORVASC) 5 MG tablet Take 5 mg by mouth daily.    Marland Kitchen atorvastatin (LIPITOR) 10 MG tablet Take 10 mg by mouth daily.    Marland Kitchen donepezil (ARICEPT) 10 MG tablet Take 10 mg by mouth at bedtime.    Marland Kitchen levothyroxine (SYNTHROID) 25 MCG tablet Take 25 mcg by mouth daily before breakfast.    . Omega-3 Fatty Acids (FISH OIL) 1000 MG CAPS Take 1,000 mg by mouth daily.     . pantoprazole (PROTONIX) 40 MG tablet Take 40 mg by mouth daily.    . Potassium Chloride ER 20 MEQ TBCR Take 20 mEq by mouth in the morning and at bedtime. Please note this is an increased dosage. Once this rx is complete, resume your previous 40meq daily. 10 tablet 0  . risperiDONE (RISPERDAL) 0.5 MG tablet Take 0.5 mg by mouth daily.    Marland Kitchen aspirin 81 MG EC tablet Take 81 mg by mouth daily.  (Patient not taking: Reported on 12/22/2019)     No current facility-administered medications for this visit.    Allergies as of 12/22/2019 - Review Complete 12/22/2019  Allergen Reaction Noted  . Codeine Nausea And Vomiting 10/28/2015  . Hydrocodone Nausea And Vomiting 10/28/2015    Family History  Adopted: Yes  Problem Relation Age of Onset  . Colon cancer Neg Hx   . Inflammatory  bowel disease Neg Hx   . Celiac disease Neg Hx   . Pancreatic cancer Neg Hx   . Liver cancer Neg Hx     Social History   Socioeconomic History  . Marital status: Widowed    Spouse name: Not on file  . Number of children: Not on file  . Years of education: Not on file  . Highest education level: Not on file  Occupational History  . Not on file  Tobacco Use  . Smoking status: Never Smoker  . Smokeless tobacco: Never Used  Vaping Use  . Vaping Use: Never used  Substance and Sexual Activity  . Alcohol use: No  . Drug use: No  . Sexual activity: Not on file  Other Topics Concern  . Not on file  Social History Narrative   Widowed,lives by herself,has 1 child,works part time in [employer]   Social Determinants of Health   Financial  Resource Strain:   . Difficulty of Paying Living Expenses: Not on file  Food Insecurity:   . Worried About Charity fundraiser in the Last Year: Not on file  . Ran Out of Food in the Last Year: Not on file  Transportation Needs:   . Lack of Transportation (Medical): Not on file  . Lack of Transportation (Non-Medical): Not on file  Physical Activity:   . Days of Exercise per Week: Not on file  . Minutes of Exercise per Session: Not on file  Stress:   . Feeling of Stress : Not on file  Social Connections:   . Frequency of Communication with Friends and Family: Not on file  . Frequency of Social Gatherings with Friends and Family: Not on file  . Attends Religious Services: Not on file  . Active Member of Clubs or Organizations: Not on file  . Attends Archivist Meetings: Not on file  . Marital Status: Not on file    Review of Systems: Gen: Denies fever, cold or flulike symptoms.  Admits to fatigue. CV: Denies chest pain or palpitations. Resp: Denies dyspnea or cough. GI: See HPI Heme: See HPI  Physical Exam: BP 120/68   Pulse 80   Temp (!) 97.1 F (36.2 C) (Temporal)   Ht 5\' 4"  (1.626 m)   Wt 182 lb 6.4 oz (82.7 kg)   LMP   (LMP Unknown)   BMI 31.31 kg/m  General:   Alert. Unable to assess orientation. No distress noted. Pleasant and cooperative.  Head:  Normocephalic and atraumatic. Eyes:  Conjuctiva clear without scleral icterus.  Heart:  S1, S2 present without murmurs appreciated. Lungs:  Clear to auscultation bilaterally. No wheezes, rales, or rhonchi. No distress.  Abdomen:  +BS, soft, and non-distended. ?TTP in LLQ vs patient being ticklish. No rebound or guarding. No HSM or masses noted. Msk:  Symmetrical without gross deformities. Normal posture. Extremities:  With 1+ edema in the ankles.  Psych: Flat affect.

## 2019-12-21 NOTE — Progress Notes (Signed)
Referring Provider: Neale Burly, MD Primary Care Physician:  Neale Burly, MD Primary GI Physician: Dr. Gala Romney  Chief Complaint  Patient presents with   Low hemoglobin    yellow stools, loose stools    HPI:   Natalie Bridges is a 74 y.o. female presenting today at the request of Hasanaj, Samul Dada, MD for profound anemia.  Last seen in our office 09/13/2019 also at the request of Dr. Sherrie Sport for profound anemia.  Due to patient's dementia, information obtained with solely from daughter-in-law, [name].  Reported 1 month of decline in appetite, nausea, and diarrhea with 5-6 BMs daily with some incontinence.  No BRBPR or melena.  A lot of belching but patient had not complained of abdominal pain.  She had been evaluated by her PCP who found a hemoglobin of 4, MCV 60, WBC 2000.  She received 3 units PRBCs.  She was on daily baby aspirin.  History of remote stroke.  Surgery due to ?possible perforated ulcer in 2010.  Plan to recheck labs, iron panel, screen for celiac disease, stool studies for infectious etiology, and TCS/possible EGD.  Labs completed 09/13/2019: Hemoglobin improved to 8.8, MCV 71.6, MCH 19.8, MCHC 27.7.  Iron 15, saturation 4%, ferritin 6.  Creatinine 0.92, potassium 2.9.  Celiac screen negative.  She was advised to take 20 mEq of potassium twice daily x5 days then resume normal dose (10 mEq daily).  Procedure 09/18/2019: Colonoscopy: 12 mm polyp at the splenic flexure (tubular adenoma), 6 mm polyp in sigmoid colon (tubular adenoma), segmental biopsies were taken and stool sample submitted for testing.  Random colon biopsies were negative for microscopic colitis.  C. difficile and GI pathogen panel were negative.  Recommended repeat colonoscopy in 3 years if health permits.  EGD: 3 columns of grade 2-3 esophageal varices, disorganized area of vascular ectatic lesion in the antrum, no portal gastropathy, ulcer, or infiltrating process.  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  Scattered  erosions with tiny white pustules involving the gastric mucosa of uncertain significance s/p biopsy.  Gastric biopsy with reactive gastropathy, intestinal metaplasia negative for dysplasia, negative for H. Pylori.  Patient had labs completed 12/15/2019 again revealing hemoglobin of 4.6.  She received 3 units PRBCs.  Today: All information was obtained from daughter-in-law today.  Patient does not contribute to conversation due to history of dementia.  Down 7 lbs in the last 3 months.  Not interested in food.  Weighed about 192 lbs 1 year ago. No nausea or vomiting. Very gassy. This is chronic. No typical GERD symptoms that we know of. Taking Protonix daily. No abdominal pain.   2-3 BMs daily. Yellow. Stools are watery. Nocturnal BMs about 3-4 times a week. No fried/fatty/greasy foods. No regular dairy products.  Diarrhea started about 6 months or so ago.   Daughter-in-law feels dementia is progressing pretty quickly.  She has some stool seepage at times.  She has urinary incontinence.  Anemia: No BRBPR or melena. No blood in the urine, vaginal bleeding, or nose bleeds. No significant bruising.  No trouble with swallowing pills. Not taking baby aspirin. No other NSAIDs.   Esophageal varices: No known history of cirrhosis. Never drank alcohol. No history of drug use. 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 No blood transfusions in the past until recently. Has swelling in her ankles. No abdominal swelling. No yellowing of eyes or skin. BP is usually in the low 100s/low 60s.  She has not taken her blood pressure  medication this morning.  Past Medical History:  Diagnosis Date   Anemia    Anxiety    Bradycardia    Dementia ([place])    Essential hypertension    History of stroke    x2   Hyperlipemia    Hypothyroidism    Pancytopenia ([place])    Urinary incontinence     Past Surgical History:  Procedure Laterality Date   COLONOSCOPY  2014   COLONOSCOPY  09/18/2019     Dr. Gala Romney; 12 mm tubular adenoma, 6 mm tubular adenoma, random colon biopsies negative for microscopic colitis, C. difficile and GI pathogen panel negative.  Repeat colonoscopy in 3 years if health permits.   ESOPHAGOGASTRODUODENOSCOPY  09/18/2019   Dr. Gala Romney; 3 columns of grade 2-3 esophageal varices, disorganized area vascular ectatic lesion in the antrum, no portal gastropathy, ulcer, or infiltrating process.  Possible gave.  Scattered erosions with tiny white pustules involving gastric mucosa s/p biopsy revealing reactive gastropathy, intestinal metaplasia, no dysplasia or H. pylori.   [place] SURGERY  2010?   ????perforated ulcer   TUBAL LIGATION      Current Outpatient Medications  Medication Sig Dispense Refill   amLODipine (NORVASC) 5 MG tablet Take 5 mg by mouth daily.     atorvastatin (LIPITOR) 10 MG tablet Take 10 mg by mouth daily.     donepezil (ARICEPT) 10 MG tablet Take 10 mg by mouth at bedtime.     levothyroxine (SYNTHROID) 25 MCG tablet Take 25 mcg by mouth daily before breakfast.     Omega-3 Fatty Acids (FISH OIL) 1000 MG CAPS Take 1,000 mg by mouth daily.      pantoprazole (PROTONIX) 40 MG tablet Take 40 mg by mouth daily.     Potassium Chloride ER 20 MEQ TBCR Take 20 mEq by mouth in the morning and at bedtime. Please note this is an increased dosage. Once this rx is complete, resume your previous 32meq daily. 10 tablet 0   risperiDONE (RISPERDAL) 0.5 MG tablet Take 0.5 mg by mouth daily.     aspirin 81 MG EC tablet Take 81 mg by mouth daily.  (Patient not taking: Reported on 12/22/2019)     No current facility-administered medications for this visit.    Allergies as of 12/22/2019 - Review Complete 12/22/2019  Allergen Reaction Noted   Codeine Nausea And Vomiting 10/28/2015   Hydrocodone Nausea And Vomiting 10/28/2015    Family History  Adopted: Yes  Problem Relation Age of Onset   Colon cancer Neg Hx    Inflammatory  bowel disease Neg Hx    Celiac disease Neg Hx    Pancreatic cancer Neg Hx    Liver cancer Neg Hx     Social History   Socioeconomic History   Marital status: Widowed    Spouse name: Not on file   Number of children: Not on file   Years of education: Not on file   Highest education level: Not on file  Occupational History   Not on file  Tobacco Use   Smoking status: Never Smoker   Smokeless tobacco: Never Used  Vaping Use   Vaping Use: Never used  Substance and Sexual Activity   Alcohol use: No   Drug use: No   Sexual activity: Not on file  Other Topics Concern   Not on file  Social History Narrative   Widowed,lives by herself,has 1 child,works part time in [employer]   Social Determinants of Health   Financial  Resource Strain:    Difficulty of Paying Living Expenses: Not on file  Food Insecurity:    Worried About [place] in the Last Year: Not on file   Ran Out of Food in the Last Year: Not on file  Transportation Needs:    Lack of Transportation (Medical): Not on file   Lack of Transportation (Non-Medical): Not on file  Physical Activity:    Days of Exercise per Week: Not on file   Minutes of Exercise per Session: Not on file  Stress:    Feeling of Stress : Not on file  Social Connections:    Frequency of Communication with Friends and Family: Not on file   Frequency of Social Gatherings with Friends and Family: Not on file   Attends Religious Services: Not on file   Active Member of Clubs or Organizations: Not on file   Attends Archivist Meetings: Not on file   Marital Status: Not on file    Review of Systems: Gen: Denies fever, cold or flulike symptoms.  Admits to fatigue. CV: Denies chest pain or palpitations. Resp: Denies dyspnea or cough. GI: See HPI Heme: See HPI  Physical Exam: BP 120/68    Pulse 80    Temp (!) 97.1 F (36.2 C) (Temporal)    Ht 5\' 4"  (1.626 m)    Wt 182 lb 6.4 oz (82.7 kg)    LMP   (LMP Unknown)    BMI 31.31 kg/m  General:   Alert. Unable to assess orientation. No distress noted. Pleasant and cooperative.  Head:  Normocephalic and atraumatic. Eyes:  Conjuctiva clear without scleral icterus.  Heart:  S1, S2 present without murmurs appreciated. Lungs:  Clear to auscultation bilaterally. No wheezes, rales, or rhonchi. No distress.  Abdomen:  +BS, soft, and non-distended. ?TTP in LLQ vs patient being ticklish. No rebound or guarding. No HSM or masses noted. Msk:  Symmetrical without gross deformities. Normal posture. Extremities:  With 1+ edema in the ankles.  Psych: Flat affect.

## 2019-12-22 ENCOUNTER — Other Ambulatory Visit: Payer: Self-pay

## 2019-12-22 ENCOUNTER — Encounter: Payer: Self-pay | Admitting: Gastroenterology

## 2019-12-22 ENCOUNTER — Telehealth: Payer: Self-pay | Admitting: *Deleted

## 2019-12-22 ENCOUNTER — Ambulatory Visit (INDEPENDENT_AMBULATORY_CARE_PROVIDER_SITE_OTHER): Payer: Medicare Other | Admitting: Gastroenterology

## 2019-12-22 VITALS — BP 120/68 | HR 80 | Temp 97.1°F | Ht 64.0 in | Wt 182.4 lb

## 2019-12-22 DIAGNOSIS — R634 Abnormal weight loss: Secondary | ICD-10-CM

## 2019-12-22 DIAGNOSIS — D509 Iron deficiency anemia, unspecified: Secondary | ICD-10-CM | POA: Diagnosis not present

## 2019-12-22 DIAGNOSIS — R197 Diarrhea, unspecified: Secondary | ICD-10-CM

## 2019-12-22 DIAGNOSIS — I8500 Esophageal varices without bleeding: Secondary | ICD-10-CM | POA: Diagnosis not present

## 2019-12-22 DIAGNOSIS — F0390 Unspecified dementia without behavioral disturbance: Secondary | ICD-10-CM

## 2019-12-22 NOTE — Assessment & Plan Note (Addendum)
Patient was found to have IDA in July 2021 with hemoglobin 4.3.  No overt GI bleeding.  She received 3 units PRBCs with posttransfusion hemoglobin up to 8.8.  Now s/p colonoscopy and EGD in July 2021 with 12 mm tubular adenoma, 6 mm tubular adenoma, random colon biopsies negative for microscopic colitis, 3 columns of grade 2-3 esophageal varices, disorganized area vascular ectatic lesion in the antrum, no portal gastropathy, scattered erosions with tiny white pustules involving the gastric mucosa of uncertain significance s/p biopsy.  Gastric biopsies with reactive gastropathy, intestinal metaplasia negative for dysplasia and negative for H. pylori.  Suspected possible GAVE.  Also with a negative celiac screen.   Recent repeat labs 12/15/2019 with hemoglobin back down to 4.6.  Received 3 units PRBCs with no posttransfusion hemoglobin.  Again denies overt GI bleeding.  No regular NSAID use.  Kidney function within normal limits in July 2021. She has not started iron.  Notably, she has also been losing weight with 7 lbs weight loss in the last 3 months. This is addressed below. Concerned for possible occult malignancy.   Plan:  Update CBC and iron panel Givens capsule ASAP. CT A/P with contrast to evaluate for possible occult malignancy.  Follow-up after Givens.

## 2019-12-22 NOTE — Telephone Encounter (Signed)
Spoke with pt daughter in [employer]. Givens scheduled for 10/25 at 7:30am, arrival 7:00am. Aware will send prep instructions to mychart. Also aware will send CT instructions as well. She voiced understanding

## 2019-12-22 NOTE — Assessment & Plan Note (Addendum)
Recent EGD with 3 columns of grade 2-3 esophageal varices.  Patient's daughter-in-law reports patient has no known history of cirrhosis.  She has never drank alcohol or used any drugs.  No known exposures to hepatitis.  No recent abdominal imaging on file.  Denies swelling in her abdomen but has mild swelling at her ankles which could be secondary to amlodipine. 1+ edema in the ankles on exam today.  No jaundice or easy bruising/bleeding.  She has history of dementia, but daughter-in-law feels it is progressing quickly.  Cannot rule out some component of hepatic encephalopathy contributing.  Plan: Would typically order ultrasound but due to weight loss as per below, planning for CT A/P with contrast ASAP. CBC, CMP, INR Ammonia Considered nonselective beta-blocker, but patient's blood pressure is 120/68 today and she has not taken her blood pressure medication.  Per daughter-in-law, blood pressure typically runs in the low 100s over low 60s.  I do not think she would tolerate a beta-blocker well.  Will need to discuss possibly banding varices with Dr. Gala Romney. Further recommendations to follow.

## 2019-12-22 NOTE — Telephone Encounter (Signed)
Pt CT A/P scheduled for 12/27/19 at 3:30pm, arrival 3:15pm, npo 4 hrs, p/u oral contrast at least 1-2 days prior Called pt home # and left detailed message on VM with appt details.  Awaiting to hear back from endo w/ dates I can schedule givens capsule study  [place] and no PA is required for CT a/p and none for givens capsule study

## 2019-12-22 NOTE — Patient Instructions (Addendum)
We will arrange for a Givens capsule study in the near future to evaluate your small bowel for signs of bleeding.  Please have labs and CT of your abdomen and pelvis completed at [facility].  For diarrhea, please start Imodium daily.  You may take 1-2 tablets in the morning and 1 tablet after each loose stool up to 4 tablets per 24 hours.  Continue following a low-fat diet.  Continue avoiding dairy products.  For weight loss, please add protein shakes twice daily.  We will plan to see you back in the office after Givens capsule study.  We will be in contact with you regarding CT and lab results.  Please monitor for bright red blood per rectum or black stools and let us know if this occurs.   Aliene Altes, PA-C [facility]

## 2019-12-22 NOTE — Assessment & Plan Note (Addendum)
Per daughter-in-law, patient has had unintentional weight loss of about 10 pounds over the last year, but documented weight loss of 7 pounds in the last 3 months.  She is not interested in food which may be secondary to worsening dementia.  No apparent nausea, vomiting, or abdominal pain.  She has been dealing with watery diarrhea for about 6 months and profound iron deficiency anemia with hemoglobin down to 4 as per above.  Colonoscopy and EGD in July 2021 with a 12 mm tubular adenoma, 6 mm tubular adenoma, segmental biopsies negative for microscopic colitis, C. 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 No H. pylori.  Celiac serologies negative.  Weight loss could be due to decreased oral intake in the setting of dementia and ongoing diarrhea. I am also concerned for possible occult malignancy.  Could also have thyroid disturbances.   Plan: CT A/P with contrast ASAP. CBC, CMP, TSH Givens capsule to further evaluate iron deficiency anemia Start protein shakes, Ensure, or boost twice daily. Diarrhea is addressed above. Follow-up after Givens.

## 2019-12-22 NOTE — Assessment & Plan Note (Addendum)
Patient has had about a 85-month history of watery diarrhea with nocturnal BMs 3-4 times a week. Stools are yellow in color.  No BRBPR or melena. Reports chronically having a lot of  Gas. Follows a low-fat diet and does not consume dairy products routinely.  She has also lost about 7 lbs in the last 3 months with no interest in eating and has profound anemia as per above.  Prior evaluation with negative celiac screen, colonoscopy in July with 2 tubular adenomas, random colon biopsies negative for microscopic colitis, C. 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   Diarrhea could be secondary to SIBO. With weight loss and anemia, concerned for possible occult malignancy.  Also need to evaluate thyroid function.  Plan:  CT A/P with contrast ASAP TSH Updated CMP Start Imodium daily.  May take 1-2 tablets in the morning and 1 tablet after each loose stool up to 4 tablets per 24 hours. Continue following a low-fat diet.   Continue avoiding dairy products.

## 2019-12-27 ENCOUNTER — Ambulatory Visit (HOSPITAL_COMMUNITY): Admission: RE | Admit: 2019-12-27 | Payer: Medicare Other | Source: Ambulatory Visit

## 2020-01-01 ENCOUNTER — Telehealth: Payer: Self-pay | Admitting: *Deleted

## 2020-01-01 NOTE — Telephone Encounter (Signed)
Received call from melanie in endo patient NS'd for givens this morning

## 2020-01-01 NOTE — Telephone Encounter (Signed)
Noted. Advising Kristen harper PA-c

## 2020-01-01 NOTE — Telephone Encounter (Signed)
It appears that I am in the office that day.

## 2020-01-01 NOTE — Telephone Encounter (Signed)
Called pt. Spoke with daughter in [employer]. Reports she forgot it was today. Pt rescheduled to 11/9 at 7:30am, arrival 7:00am. I went in detail over instructions with daughter in line. Also these have been mailed   Forwarding to provider covering hospital on 11/10 for reading of givens

## 2020-01-01 NOTE — Telephone Encounter (Signed)
Noted  

## 2020-01-02 ENCOUNTER — Other Ambulatory Visit: Payer: Self-pay

## 2020-01-02 ENCOUNTER — Ambulatory Visit (HOSPITAL_COMMUNITY)
Admission: RE | Admit: 2020-01-02 | Discharge: 2020-01-02 | Disposition: A | Discharge status: Home / Self Care | Payer: Medicare Other | Source: Ambulatory Visit | Attending: Gastroenterology | Admitting: Gastroenterology

## 2020-01-02 DIAGNOSIS — D509 Iron deficiency anemia, unspecified: Secondary | ICD-10-CM | POA: Insufficient documentation

## 2020-01-02 DIAGNOSIS — R197 Diarrhea, unspecified: Secondary | ICD-10-CM | POA: Diagnosis not present

## 2020-01-02 DIAGNOSIS — I8500 Esophageal varices without bleeding: Secondary | ICD-10-CM | POA: Diagnosis not present

## 2020-01-02 DIAGNOSIS — R634 Abnormal weight loss: Secondary | ICD-10-CM | POA: Insufficient documentation

## 2020-01-02 DIAGNOSIS — I714 Abdominal aortic aneurysm, without rupture: Secondary | ICD-10-CM | POA: Diagnosis not present

## 2020-01-02 LAB — POCT I-STAT CREATININE: Creatinine, Ser: 0.9 mg/dL (ref 0.44–1.00)

## 2020-01-02 MED ORDER — IOHEXOL 300 MG/ML  SOLN
100.0000 mL | Freq: Once | INTRAMUSCULAR | Status: AC | PRN
  Administered 2020-01-02: 100 mL via INTRAVENOUS

## 2020-01-03 ENCOUNTER — Other Ambulatory Visit: Payer: Self-pay

## 2020-01-03 DIAGNOSIS — Z1159 Encounter for screening for other viral diseases: Secondary | ICD-10-CM

## 2020-01-03 DIAGNOSIS — Z79899 Other long term (current) drug therapy: Secondary | ICD-10-CM

## 2020-01-03 NOTE — Progress Notes (Signed)
CT does reveal cirrhosis with a small amount of ascites (free fluid in her abdomen). Spleen is mildly enlarged which is a common finding and cirrhosis.  Colon wall appears mildly thickened which is likely secondary to her underlying cirrhosis.  Doubt colitis as she had a colonoscopy in July without evidence of colitis.  Abdominal aortic aneurysm measuring 4cm.   Two cyst in her right ovary with recommendations for an [place].   Her appendix also appears thickened. Stated this could be secondary to liver disease/ascites, but acute appendicitis, mucocele, or underlying mass can't be excluded.  I doubt patient has acute appendicitis as she had no right lower quadrant abdominal pain when I saw her in the office.   Recommendations:  1. Please ask if patient is having and RLQ pain or fever. Please ask how her diarrhea is doing.   2. To further characterize the appendix, we could order an MRI; however, I am not sure that the patient would be able to lay still long enough due to underlying dementia for MRI to be completed.  Please see if the daughter-in-law thinks patient would be able to lay still for an MRI. The other option would be to refer to surgery for consideration of appendectomy.   3. Please remind patient to have labs completed. We need to add on Hep A Ab total, Hep B surface Ab, Hep B surface Ag, Hep B core Ab total, and Hep C Ab, AFP. Elmo Putt please arrange.   4. For Cirrhosis:          She needs labs completed. We will have additional recommendations thereafter.          Due to small ascites, she needs to follow a strict low sodium diet. No more than 2000 mg daily which includes all foods and liquids consumed.          No more than 2000 mg tylenol per 24 hours.   5. For abdominal aortic aneurysm, this will need to be followed up every 12 months.  She should see vascular surgery for follow-up.  We can make the referral.  6. She will need to see PCP to follow-up on ovarian cyst. We can send a  copy of the CT report to her PCP.   Manuela Schwartz: Please send copy of CT to Dr. Sherrie Sport noting follow-up needed on ovarian cyst.

## 2020-01-04 ENCOUNTER — Other Ambulatory Visit: Payer: Self-pay | Admitting: *Deleted

## 2020-01-04 ENCOUNTER — Telehealth: Payer: Self-pay | Admitting: Internal Medicine

## 2020-01-04 DIAGNOSIS — R9389 Abnormal findings on diagnostic imaging of other specified body structures: Secondary | ICD-10-CM

## 2020-01-04 DIAGNOSIS — I714 Abdominal aortic aneurysm, without rupture, unspecified: Secondary | ICD-10-CM

## 2020-01-04 NOTE — Telephone Encounter (Signed)
Natalie Bridges [phone]  Please call patient daughter-in-law, she has some questions.  Asked about sedation and an MRI

## 2020-01-04 NOTE — Telephone Encounter (Signed)
Documentation in result note.

## 2020-01-10 ENCOUNTER — Telehealth: Payer: Self-pay | Admitting: Internal Medicine

## 2020-01-10 DIAGNOSIS — Z79899 Other long term (current) drug therapy: Secondary | ICD-10-CM | POA: Diagnosis not present

## 2020-01-10 DIAGNOSIS — Z1159 Encounter for screening for other viral diseases: Secondary | ICD-10-CM | POA: Diagnosis not present

## 2020-01-10 NOTE — Telephone Encounter (Signed)
Labs faxed

## 2020-01-10 NOTE — Telephone Encounter (Signed)
Pt is at [facility] to have labs done and they do not have the orders. Please fax to [phone]

## 2020-01-16 ENCOUNTER — Ambulatory Visit (HOSPITAL_COMMUNITY)
Admission: RE | Admit: 2020-01-16 | Discharge: 2020-01-16 | Disposition: A | Discharge status: Home / Self Care | Payer: Medicare Other | Source: Home / Self Care | Attending: Internal Medicine | Admitting: Internal Medicine

## 2020-01-16 ENCOUNTER — Encounter (HOSPITAL_COMMUNITY)
Admission: RE | Disposition: A | Discharge status: Home / Self Care | Payer: Self-pay | Source: Home / Self Care | Attending: Internal Medicine

## 2020-01-16 DIAGNOSIS — D509 Iron deficiency anemia, unspecified: Secondary | ICD-10-CM | POA: Insufficient documentation

## 2020-01-16 DIAGNOSIS — Z885 Allergy status to narcotic agent status: Secondary | ICD-10-CM | POA: Diagnosis not present

## 2020-01-16 DIAGNOSIS — I10 Essential (primary) hypertension: Secondary | ICD-10-CM | POA: Diagnosis not present

## 2020-01-16 DIAGNOSIS — K31A11 Gastric intestinal metaplasia without dysplasia, involving the antrum: Secondary | ICD-10-CM | POA: Insufficient documentation

## 2020-01-16 DIAGNOSIS — K3189 Other diseases of stomach and duodenum: Secondary | ICD-10-CM | POA: Diagnosis not present

## 2020-01-16 DIAGNOSIS — K922 Gastrointestinal hemorrhage, unspecified: Secondary | ICD-10-CM | POA: Diagnosis not present

## 2020-01-16 DIAGNOSIS — K766 Portal hypertension: Secondary | ICD-10-CM | POA: Diagnosis not present

## 2020-01-16 DIAGNOSIS — Z86010 Personal history of colonic polyps: Secondary | ICD-10-CM | POA: Insufficient documentation

## 2020-01-16 DIAGNOSIS — K7469 Other cirrhosis of liver: Secondary | ICD-10-CM | POA: Diagnosis not present

## 2020-01-16 DIAGNOSIS — I8500 Esophageal varices without bleeding: Secondary | ICD-10-CM | POA: Insufficient documentation

## 2020-01-16 DIAGNOSIS — T189XXD Foreign body of alimentary tract, part unspecified, subsequent encounter: Secondary | ICD-10-CM | POA: Diagnosis not present

## 2020-01-16 DIAGNOSIS — E039 Hypothyroidism, unspecified: Secondary | ICD-10-CM | POA: Diagnosis not present

## 2020-01-16 DIAGNOSIS — E785 Hyperlipidemia, unspecified: Secondary | ICD-10-CM | POA: Diagnosis not present

## 2020-01-16 DIAGNOSIS — D62 Acute posthemorrhagic anemia: Secondary | ICD-10-CM | POA: Diagnosis not present

## 2020-01-16 DIAGNOSIS — Z79899 Other long term (current) drug therapy: Secondary | ICD-10-CM | POA: Diagnosis not present

## 2020-01-16 DIAGNOSIS — Z8673 Personal history of transient ischemic attack (TIA), and cerebral infarction without residual deficits: Secondary | ICD-10-CM | POA: Diagnosis not present

## 2020-01-16 DIAGNOSIS — K31811 Angiodysplasia of stomach and duodenum with bleeding: Secondary | ICD-10-CM | POA: Diagnosis not present

## 2020-01-16 DIAGNOSIS — I8510 Secondary esophageal varices without bleeding: Secondary | ICD-10-CM | POA: Diagnosis not present

## 2020-01-16 DIAGNOSIS — K7460 Unspecified cirrhosis of liver: Secondary | ICD-10-CM | POA: Insufficient documentation

## 2020-01-16 DIAGNOSIS — Z7982 Long term (current) use of aspirin: Secondary | ICD-10-CM | POA: Diagnosis not present

## 2020-01-16 HISTORY — PX: GIVENS CAPSULE STUDY: SHX5432

## 2020-01-16 SURGERY — IMAGING PROCEDURE, GI TRACT, INTRALUMINAL, VIA CAPSULE

## 2020-01-17 ENCOUNTER — Other Ambulatory Visit: Payer: Self-pay

## 2020-01-17 ENCOUNTER — Inpatient Hospital Stay (HOSPITAL_COMMUNITY)
Admission: EM | Admit: 2020-01-17 | Discharge: 2020-01-19 | DRG: 378 | Disposition: A | Discharge status: Home / Self Care | Payer: Medicare Other | Source: Ambulatory Visit | Attending: Internal Medicine | Admitting: Internal Medicine

## 2020-01-17 ENCOUNTER — Emergency Department (HOSPITAL_COMMUNITY): Payer: Medicare Other

## 2020-01-17 ENCOUNTER — Telehealth: Payer: Self-pay | Admitting: Gastroenterology

## 2020-01-17 ENCOUNTER — Encounter (HOSPITAL_COMMUNITY): Payer: Self-pay | Admitting: Emergency Medicine

## 2020-01-17 DIAGNOSIS — I8500 Esophageal varices without bleeding: Secondary | ICD-10-CM | POA: Diagnosis not present

## 2020-01-17 DIAGNOSIS — K766 Portal hypertension: Secondary | ICD-10-CM | POA: Diagnosis present

## 2020-01-17 DIAGNOSIS — Z79899 Other long term (current) drug therapy: Secondary | ICD-10-CM

## 2020-01-17 DIAGNOSIS — I8510 Secondary esophageal varices without bleeding: Secondary | ICD-10-CM | POA: Diagnosis present

## 2020-01-17 DIAGNOSIS — I10 Essential (primary) hypertension: Secondary | ICD-10-CM | POA: Diagnosis present

## 2020-01-17 DIAGNOSIS — Z20822 Contact with and (suspected) exposure to covid-19: Secondary | ICD-10-CM | POA: Diagnosis present

## 2020-01-17 DIAGNOSIS — Z79890 Hormone replacement therapy (postmenopausal): Secondary | ICD-10-CM

## 2020-01-17 DIAGNOSIS — T189XXA Foreign body of alimentary tract, part unspecified, initial encounter: Secondary | ICD-10-CM

## 2020-01-17 DIAGNOSIS — K31811 Angiodysplasia of stomach and duodenum with bleeding: Principal | ICD-10-CM | POA: Diagnosis present

## 2020-01-17 DIAGNOSIS — K922 Gastrointestinal hemorrhage, unspecified: Principal | ICD-10-CM

## 2020-01-17 DIAGNOSIS — D509 Iron deficiency anemia, unspecified: Secondary | ICD-10-CM | POA: Diagnosis not present

## 2020-01-17 DIAGNOSIS — D62 Acute posthemorrhagic anemia: Secondary | ICD-10-CM

## 2020-01-17 DIAGNOSIS — K3189 Other diseases of stomach and duodenum: Secondary | ICD-10-CM | POA: Diagnosis present

## 2020-01-17 DIAGNOSIS — F0390 Unspecified dementia without behavioral disturbance: Secondary | ICD-10-CM | POA: Diagnosis present

## 2020-01-17 DIAGNOSIS — K7469 Other cirrhosis of liver: Secondary | ICD-10-CM

## 2020-01-17 DIAGNOSIS — Z885 Allergy status to narcotic agent status: Secondary | ICD-10-CM

## 2020-01-17 DIAGNOSIS — Z7982 Long term (current) use of aspirin: Secondary | ICD-10-CM

## 2020-01-17 DIAGNOSIS — E039 Hypothyroidism, unspecified: Secondary | ICD-10-CM | POA: Diagnosis present

## 2020-01-17 DIAGNOSIS — E785 Hyperlipidemia, unspecified: Secondary | ICD-10-CM | POA: Diagnosis present

## 2020-01-17 DIAGNOSIS — Z8673 Personal history of transient ischemic attack (TIA), and cerebral infarction without residual deficits: Secondary | ICD-10-CM

## 2020-01-17 LAB — CBC WITH DIFFERENTIAL/PLATELET
Abs Immature Granulocytes: 0.01 10*3/uL (ref 0.00–0.07)
Basophils Absolute: 0.1 10*3/uL (ref 0.0–0.1)
Basophils Relative: 2 %
Eosinophils Absolute: 0.2 10*3/uL (ref 0.0–0.5)
Eosinophils Relative: 6 %
HCT: 27.2 % — ABNORMAL LOW (ref 36.0–46.0)
Hemoglobin: 7.6 g/dL — ABNORMAL LOW (ref 12.0–15.0)
Immature Granulocytes: 0 %
Lymphocytes Relative: 33 %
Lymphs Abs: 0.9 10*3/uL (ref 0.7–4.0)
MCH: 21.8 pg — ABNORMAL LOW (ref 26.0–34.0)
MCHC: 27.9 g/dL — ABNORMAL LOW (ref 30.0–36.0)
MCV: 78.2 fL — ABNORMAL LOW (ref 80.0–100.0)
Monocytes Absolute: 0.4 10*3/uL (ref 0.1–1.0)
Monocytes Relative: 14 %
Neutro Abs: 1.2 10*3/uL — ABNORMAL LOW (ref 1.7–7.7)
Neutrophils Relative %: 45 %
Platelets: 201 10*3/uL (ref 150–400)
RBC: 3.48 MIL/uL — ABNORMAL LOW (ref 3.87–5.11)
RDW: 24.3 % — ABNORMAL HIGH (ref 11.5–15.5)
WBC: 2.7 10*3/uL — ABNORMAL LOW (ref 4.0–10.5)
nRBC: 0 % (ref 0.0–0.2)

## 2020-01-17 LAB — COMPREHENSIVE METABOLIC PANEL
ALT: 14 U/L (ref 0–44)
AST: 31 U/L (ref 15–41)
Albumin: 2.7 g/dL — ABNORMAL LOW (ref 3.5–5.0)
Alkaline Phosphatase: 53 U/L (ref 38–126)
Anion gap: 7 (ref 5–15)
BUN: 12 mg/dL (ref 8–23)
CO2: 25 mmol/L (ref 22–32)
Calcium: 8.6 mg/dL — ABNORMAL LOW (ref 8.9–10.3)
Chloride: 106 mmol/L (ref 98–111)
Creatinine, Ser: 0.78 mg/dL (ref 0.44–1.00)
GFR, Estimated: >60 mL/min (ref >60)
Glucose, Bld: 123 mg/dL — ABNORMAL HIGH (ref 70–99)
Potassium: 3.5 mmol/L (ref 3.5–5.1)
Sodium: 138 mmol/L (ref 135–145)
Total Bilirubin: 0.8 mg/dL (ref 0.3–1.2)
Total Protein: 5.9 g/dL — ABNORMAL LOW (ref 6.5–8.1)

## 2020-01-17 LAB — PROTIME-INR
INR: 1.2 (ref 0.8–1.2)
Prothrombin Time: 15 seconds (ref 11.4–15.2)

## 2020-01-17 LAB — RESP PANEL BY RT PCR (RSV, FLU A&B, COVID)
Influenza A by PCR: NEGATIVE
Influenza B by PCR: NEGATIVE
Respiratory Syncytial Virus by PCR: NEGATIVE
SARS Coronavirus 2 by RT PCR: NEGATIVE

## 2020-01-17 LAB — LIPASE, BLOOD: Lipase: 27 U/L (ref 11–51)

## 2020-01-17 LAB — ETHANOL: Alcohol, Ethyl (B): <10 mg/dL (ref <10)

## 2020-01-17 LAB — PREPARE RBC (CROSSMATCH)

## 2020-01-17 LAB — ABO/RH: ABO/RH(D): A POS

## 2020-01-17 LAB — AMMONIA: Ammonia: 23 umol/L (ref 9–35)

## 2020-01-17 MED ORDER — SODIUM CHLORIDE 0.9 % IV SOLN
80.0000 mg | Freq: Once | INTRAVENOUS | Status: AC
  Administered 2020-01-17: 80 mg via INTRAVENOUS
  Filled 2020-01-17: qty 80

## 2020-01-17 MED ORDER — RISPERIDONE 0.5 MG PO TABS
0.5000 mg | ORAL_TABLET | Freq: Every day | ORAL | Status: DC
  Administered 2020-01-19: 0.5 mg via ORAL
  Filled 2020-01-17 (×2): qty 1

## 2020-01-17 MED ORDER — SODIUM CHLORIDE 0.9 % IV SOLN
8.0000 mg/h | INTRAVENOUS | Status: DC
  Administered 2020-01-17 – 2020-01-18 (×2): 8 mg/h via INTRAVENOUS
  Filled 2020-01-17 (×6): qty 80

## 2020-01-17 MED ORDER — PANTOPRAZOLE SODIUM 40 MG IV SOLR: 40.0000 mg | Freq: Two times a day (BID) | INTRAVENOUS | Status: DC

## 2020-01-17 MED ORDER — SODIUM CHLORIDE 0.9 % IV SOLN: INTRAVENOUS | Status: DC

## 2020-01-17 MED ORDER — AMLODIPINE BESYLATE 5 MG PO TABS
5.0000 mg | ORAL_TABLET | Freq: Every day | ORAL | Status: DC
  Administered 2020-01-19: 5 mg via ORAL
  Filled 2020-01-17 (×2): qty 1

## 2020-01-17 MED ORDER — ATORVASTATIN CALCIUM 10 MG PO TABS
10.0000 mg | ORAL_TABLET | Freq: Every day | ORAL | Status: DC
  Administered 2020-01-19: 10 mg via ORAL
  Filled 2020-01-17 (×2): qty 1

## 2020-01-17 MED ORDER — SODIUM CHLORIDE 0.9 % IV SOLN
50.0000 ug/h | INTRAVENOUS | Status: DC
  Administered 2020-01-17 – 2020-01-18 (×3): 50 ug/h via INTRAVENOUS
  Filled 2020-01-17 (×6): qty 1

## 2020-01-17 MED ORDER — TETANUS-DIPHTH-ACELL PERTUSSIS 5-2.5-18.5 LF-MCG/0.5 IM SUSY: 0.5000 mL | PREFILLED_SYRINGE | Freq: Once | INTRAMUSCULAR | Status: DC

## 2020-01-17 MED ORDER — DONEPEZIL HCL 5 MG PO TABS
10.0000 mg | ORAL_TABLET | Freq: Every day | ORAL | Status: DC
  Administered 2020-01-17 – 2020-01-18 (×2): 10 mg via ORAL
  Filled 2020-01-17 (×2): qty 2

## 2020-01-17 MED ORDER — LEVOTHYROXINE SODIUM 25 MCG PO TABS
25.0000 ug | ORAL_TABLET | Freq: Every day | ORAL | Status: DC
  Administered 2020-01-18 – 2020-01-19 (×2): 25 ug via ORAL
  Filled 2020-01-17: qty 1

## 2020-01-17 MED ORDER — SODIUM CHLORIDE 0.9% IV SOLUTION: Freq: Once | INTRAVENOUS | Status: DC

## 2020-01-17 MED ORDER — OCTREOTIDE LOAD VIA INFUSION
50.0000 ug | Freq: Once | INTRAVENOUS | Status: AC
  Administered 2020-01-17: 50 ug via INTRAVENOUS
  Filled 2020-01-17: qty 25

## 2020-01-17 MED ORDER — ONDANSETRON HCL 4 MG/2ML IJ SOLN: 4.0000 mg | Freq: Four times a day (QID) | INTRAMUSCULAR | Status: DC | PRN

## 2020-01-17 NOTE — Telephone Encounter (Signed)
requested

## 2020-01-17 NOTE — Op Note (Signed)
Small Bowel Givens Capsule Study Procedure date:  01/16/20  Referring Provider:  Aliene Altes, PA PCP:  Dr. 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 Gastric biopsy with gastric antral type mucosa with reactive gastropathy, intestinal metaplasia, negative for H. pylori or dysplasia..  Colonoscopy also completed 09/11/2019 with 12 mm polyp in the splenic flexure (tubular adenoma), 6 mm polyp in the sigmoid colon (tubular adenoma), segmental biopsies negative for microscopic colitis.   Patient data:  Wt: 70.3 kg Ht: 5\' 4"   Findings: Diffusely abnormal gastric mucosa that is erythematous, edematous, with diffuse scattered erosions and evidence of active bleeding with wisps of fresh blood along with some pooling of fresh blood noted throughout the stomach.  Possible actively bleeding lesion at 00:34:52, 00:57:38, and 01:02:05, otherwise, unable to identify a specific bleeding lesions due to retained debris. Also with what looks like scattered clots of blood within the stomach (examples at 00:54:01, 01:43:02). No typical ulcerations appreciated. Scattered areas of erythema with overlaying white material that I suspect is debris (01:43:54, 01:47:26). Unable to identify any landmarks beyond the stomach due to retained debris. There is additional fresh red blood at the end of the study at 07:50:27, but I am not sure where this is within the GI tract.  First Gastric image:  00:00:23 First Duodenal image: Unable to identify First Cecal image: Unable to identify Gastric Passage time: Unable to calculate  Small Bowel Passage time:  Unable to calculate   Summary & Recommendations: 75 y.o. 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 Givens capsule with what looks like GAVE with active bleeding (see findings above). Unable to identify any landmarks beyond the stomach due to retained debris. There is an area at the end of the study with fresh red blood as well, but unable to determine where this is in the GI tract.   Due to active bleeding with history of cirrhosis, patient needs to proceed to the hospital for further evaluation and management. She will need stat labs, IV antibiotics for SBP prophylaxis, and likely octreotide or PPI infusion due to active bleeding. She is going to need EGD with bleeding control therapy. I have called and spoke with patient's daughter in law who states they will proceed to the emergency room. I have also updated Dr. Laural Golden and ED charge nurse.   She is going to need at least an X-ray to determine if the capsule made it to the colon. This can be completed in the emergency room.   Moving forward, patient will likely need to follow with hematology for close monitoring.   Aliene Altes, PA-C [facility] 01/17/2020

## 2020-01-17 NOTE — Consult Note (Signed)
GI Inpatient Consult Note  Reason for Consult: GI bleed   History of Present Illness: Natalie Bridges is a 74 y.o. female seen for evaluation of GI bleed. PMHx of dementia, bradycardia, hypertension, stroke, hyperlipidemia, cirrhosis.   She is seen in the emergency room today after having a capsule endoscopy yesterday showing fresh blood throughout the stomach with bright red blood noted at the end of the study.  Study also had a significant amount of debris's involved.   Initially established care with [facility] July 2021 for anemia-at that point had 1 month of poor appetite nausea and diarrhea.  Hemoglobin had found to be down to 4 with MCV of 60.  She received 3 units of blood.  She had a colonoscopy showing 12 mm polyp, 6 mm polyp, random biopsies negative for microscopic colitis and infectious stool studies were negative.  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  She had additional labs again in October 2021 revealing a hemoglobin of 4.6 and was transfused 3 units of PRBCs.  Further work-up included a CT abdomen pelvis given the weight loss and poor appetite as well as the Givens capsule yesterday as above.  Seen in emergency room accompanied by her daughter-in-law Lenna Sciara who provides all hx. Melissa notes patient has been sleeping a lot over the past week.  She denies any other specific complaints.  Patient has been unsteady and had a poor appetite.  Denies any complaints of abdominal pain, nausea or vomiting.  Appetite appears to be poor but denies postprandial abdominal pain or dysphagia, etc.  Continues to have 3-4 loose stools a day, denies any noted blood or dark stools.  Overall patient has lost about 5 to 7 pounds recently.  Denies any NSAID tobacco or alcohol intake.  No longer on iron or baby aspirin.   Last Colonoscopy: - One 12 mm polyp at the splenic flexure, removed with a hot snare. Resected and retrieved. Status post segmental  biopsy and stool sampling. - One 6 mm polyp in the sigmoid colon, removed with a cold snare. Resected and retrieved. Pathology with 2 tubular adenomas.  Random biopsies without microscopic colitis.  Last Endoscopy: 09/2019-3 columns of grade 2-3 esophageal varices found involving the distal 5 to 7 cm of the tubular esophagus. Disorgamized area of vascular ectatic lesion seen in the antrum. No typical portal gastropathy; no ulcer or infiltrating process. Patient did have scattered erosions with tiny white pustules involving the gastric mucosa of uncertain significance. Gastric biopsies taken. Findings: -Esophageal varices. Possible early G AVE. inflamed abnormal appearing gastric mucosa of uncertain significance?status post biopsy  Stomach biopsies negative for H. pylori.  Past Medical History:  Past Medical History:  Diagnosis Date  . Anemia   . Anxiety   . Bradycardia   . Dementia ([place])   . Essential hypertension   . History of stroke    x2  . Hyperlipemia   . Hypothyroidism   . Pancytopenia ([place])   . Urinary incontinence     Problem List: Patient Active Problem List   Diagnosis Date Noted  . Esophageal varices ([place]) 12/22/2019  . Microcytic anemia 09/13/2019  . Diarrhea 09/13/2019  . Loss of weight 09/13/2019    Past Surgical History: Past Surgical History:  Procedure Laterality Date  . COLONOSCOPY  2014  . COLONOSCOPY  09/18/2019   Dr. Gala Romney; 12 mm tubular adenoma, 6 mm tubular adenoma, random colon biopsies negative for microscopic colitis, C. difficile and GI pathogen panel  negative.  Repeat colonoscopy in 3 years if health permits.  . ESOPHAGOGASTRODUODENOSCOPY  09/18/2019   Dr. Gala Romney; 3 columns of grade 2-3 esophageal varices, disorganized area vascular ectatic lesion in the antrum, no portal gastropathy, ulcer, or infiltrating process.  Possible gave.  Scattered erosions with tiny white pustules involving gastric mucosa s/p biopsy revealing reactive gastropathy,  intestinal metaplasia, no dysplasia or H. pylori.  Marland Kitchen HEMORRHOID SURGERY    . STOMACH SURGERY  2010?   ????perforated ulcer  . TUBAL LIGATION      Allergies: Allergies  Allergen Reactions  . Codeine Nausea And Vomiting  . Hydrocodone Nausea And Vomiting    Home Medications: (Not in a hospital admission)  Home medication reconciliation was completed with the patient.   Scheduled Inpatient Medications:   . [START ON 01/21/2020] pantoprazole  40 mg Intravenous Q12H    Continuous Inpatient Infusions:   . sodium chloride 125 mL/hr at 01/17/20 1304  . octreotide  (SANDOSTATIN)    IV infusion 50 mcg/hr (01/17/20 1308)  . pantoprozole (PROTONIX) infusion    . pantoprazole (PROTONIX) 80 mg IVPB      PRN Inpatient Medications:    Family History: family history is not on file. She was adopted.    Social History:   reports that she has never smoked. She has never used smokeless tobacco. She reports that she does not drink alcohol and does not use drugs.   Review of Systems: Constitutional: +weight loss  Eyes: No changes in vision. ENT: No oral lesions, sore throat.  GI: see HPI.  Heme/Lymph: No easy bruising.  CV: No chest pain.  GU: No hematuria.  Integumentary: No rashes.  Neuro: No headaches.  Psych: No depression/anxiety.  Endocrine: No heat/cold intolerance.  Allergic/Immunologic: No urticaria.  Resp: No cough, SOB.  Musculoskeletal: No joint swelling.    Physical Examination: BP 108/62   Pulse (!) 56   Temp 98 F (36.7 C) (Tympanic)   Resp (!) 21   Ht 5\' 4"  (1.626 m)   Wt 74.8 kg   LMP  (LMP Unknown)   SpO2 95%   BMI 28.32 kg/m  Gen: NAD, alert and oriented x 2. No orientated to time. Answers questions w/ one word. HEENT: PEERLA, EOMI, Neck: supple, no JVD or thyromegaly Chest: CTA bilaterally, no wheezes, crackles, or other adventitious sounds CV: HR in 50s, no m/g/c/r Abd: soft, NT, ND, +BS in all four quadrants; no HSM, guarding, ridigity, or  rebound tenderness Ext: no edema, well perfused with 2+ pulses, Skin: no rash or lesions noted Lymph: no LAD  Data: Lab Results  Component Value Date   WBC 2.1 (L) 09/13/2019   HGB 8.8 (L) 09/13/2019   HCT 31.8 (L) 09/13/2019   MCV 71.6 (L) 09/13/2019   PLT 201 09/13/2019   No results for input(s): HGB in the last 168 hours. Lab Results  Component Value Date   NA 138 01/17/2020   K 3.5 01/17/2020   CL 106 01/17/2020   CO2 25 01/17/2020   BUN 12 01/17/2020   CREATININE 0.78 01/17/2020   Lab Results  Component Value Date   ALT 14 01/17/2020   AST 31 01/17/2020   ALKPHOS 53 01/17/2020   BILITOT 0.8 01/17/2020   No results for input(s): APTT, INR, PTT in the last 168 hours.  CT a/p 01/02/20 - IMPRESSION: 1. Cirrhosis with evidence of portal hypertension, small ascites, and splenomegaly. 2. Thickened appearance of the appendix may be related to underlying ascites and liver disease. Acute  appendicitis or an appendiceal lesion such as mucocele is not entirely excluded. Clinical correlation is recommended. If there is concern for appendiceal mass further evaluation with MRI is advised. 3. Hepatic colopathy versus mild colitis. Clinical correlation is recommended. 4. A 4 cm fusiform infrarenal abdominal aortic aneurysm. Recommend follow-up every 12 months and vascular consultation. This recommendation follows ACR consensus guidelines: White Paper of the ACR Incidental Findings Committee II on Vascular Findings. J Am Coll Radiol 2013; 10:789-794. 5. Two adjacent cysts or a septated cyst in the region of the right ovary. Further evaluation with ultrasound is advised. 6. Aortic Atherosclerosis (ICD10-I70.0).   Assessment/Plan: Ms. Bress is a 74 y.o. female with past medical history of cirrhosis seen for evaluation of GI bleed.   1. 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 Hgb on admission  7.6. Plan for EGD with APC tomorrow with Dr. Daiva Nakayama. Continue PPI and octreotide drip in interim. Okay for clear liquids today then NPO at midnight. Bradycardia may limit beta blocker use in future. Also due for AFP  2. Dementia - overall significant decline in past few months. Orientated to person and place but not time. check ammonia to ensure HE not worsening.   3. Abnormal CT - thickened appearance of appendix and has appt as outpatient tomorrow w/ Dr Arnoldo Morale. I contacted his office to cancel appt at family request given will be admitted. Can reschedule after discharge. No RLQ pain.    Case was discussed with Dr. Laural Golden. Thank you for the consult. Please call with questions or concerns.  Laurine Blazer, PA-C [facility]

## 2020-01-17 NOTE — Telephone Encounter (Signed)
Natalie Bridges, can you request labs from [facility]? I do not have results but daughter states labs were completed.

## 2020-01-17 NOTE — H&P (Signed)
History and Physical    Natalie Bridges BTD:[id number] DOB: 1946-01-25 DOA: 01/17/2020  PCP: Neale Burly, MD   Patient coming from: Home  I have personally briefly reviewed patient's old medical records in [place]  Chief Complaint: Acute on chronic anemia with abnormal capsule endoscopy suggesting active GI bleed.  HPI: Natalie Bridges is a 74 y.o. female with medical history significant of chronic anemia, essential hypertension, hyperlipidemia, hypothyroidism, nonalcoholic liver cirrhosis, hx of GAVE and esophageal varices; who presented to the hospital secondary to acute on chronic anemia with findings on recent capsule endoscopy for active GI bleed.  Patient denies chest pain, shortness of breath, nausea, vomiting or abdominal discomfort.  At home there was description of slightly darker appearance in her stools but no frank melanotic changes.  There is no focal neurologic deficits, no complaints of URI symptoms, no dysuria, no sick contacts, no fever chills or any other complaints.  Overall history was limited due to patient underlying dementia.  ED Course: Hemoglobin 7.6, hemodynamically stable.  No chest pain, no active nausea vomiting.  2 large IV ports in place.  Patient typed and screened.  Started on IV PPI and octreotide.  GI service consulted and TRH contacted to place patient in the hospital for further evaluation and management.  Review of Systems: As per HPI otherwise all other systems reviewed and are negative.  Patient with underlying history of dementia but no contributing much with history/ROS.   Past Medical History:  Diagnosis Date  . Anemia   . Anxiety   . Bradycardia   . Dementia ([place])   . Essential hypertension   . History of stroke    x2  . Hyperlipemia   . Hypothyroidism   . Pancytopenia ([place])   . Urinary incontinence     Past Surgical History:  Procedure Laterality Date  . COLONOSCOPY  2014  . COLONOSCOPY  09/18/2019   Dr. Gala Romney; 12 mm  tubular adenoma, 6 mm tubular adenoma, random colon biopsies negative for microscopic colitis, C. difficile and GI pathogen panel negative.  Repeat colonoscopy in 3 years if health permits.  . ESOPHAGOGASTRODUODENOSCOPY  09/18/2019   Dr. Gala Romney; 3 columns of grade 2-3 esophageal varices, disorganized area vascular ectatic lesion in the antrum, no portal gastropathy, ulcer, or infiltrating process.  Possible gave.  Scattered erosions with tiny white pustules involving gastric mucosa s/p biopsy revealing reactive gastropathy, intestinal metaplasia, no dysplasia or H. pylori.  Marland Kitchen HEMORRHOID SURGERY    . STOMACH SURGERY  2010?   ????perforated ulcer  . TUBAL LIGATION      Social History  reports that she has never smoked. She has never used smokeless tobacco. She reports that she does not drink alcohol and does not use drugs.  Allergies  Allergen Reactions  . Codeine Nausea And Vomiting  . Hydrocodone Nausea And Vomiting    Family History  Adopted: Yes  Problem Relation Age of Onset  . Colon cancer Neg Hx   . Inflammatory bowel disease Neg Hx   . Celiac disease Neg Hx   . Pancreatic cancer Neg Hx   . Liver cancer Neg Hx     Prior to Admission medications   Medication Sig Start Date End Date Taking? Authorizing Provider  amLODipine (NORVASC) 5 MG tablet Take 5 mg by mouth daily.   Yes [provider]  atorvastatin (LIPITOR) 10 MG tablet Take 10 mg by mouth daily.   Yes [provider]  donepezil (ARICEPT) 10 MG tablet Take 10  mg by mouth at bedtime.   Yes [provider]  Ferrous Sulfate (IRON PO) Take 1 tablet by mouth daily.   Yes [provider]  levothyroxine (SYNTHROID) 25 MCG tablet Take 25 mcg by mouth daily before breakfast.   Yes [provider]  Omega-3 Fatty Acids (FISH OIL) 1000 MG CAPS Take 1,000 mg by mouth daily.    Yes [provider]  pantoprazole (PROTONIX) 40 MG tablet Take 40 mg by mouth daily.   Yes [provider]  risperiDONE (RISPERDAL) 0.5 MG tablet Take 0.5 mg by mouth daily. 09/06/19  Yes [provider]  Potassium Chloride ER 20 MEQ TBCR Take 20 mEq by mouth in the morning and at bedtime. Please note this is an increased dosage. Once this rx is complete, resume your previous 16meq daily. Patient not taking: Reported on 01/17/2020 09/14/19   Mahala Menghini, PA-C    Physical Exam: Vitals:   01/17/20 1600 01/17/20 1615 01/17/20 1630 01/17/20 1755  BP: (!) 116/51  (!) 121/51 (!) 111/58  Pulse: (!) 55 (!) 51 (!) 53 (!) 51  Resp: 18 17 15 16   Temp:    98 F (36.7 C)  TempSrc:    Oral  SpO2: 96% 97% 96% 98%  Weight:      Height:        Constitutional: In no acute distress; denies chest pain, no nausea or vomiting. Vitals:   01/17/20 1600 01/17/20 1615 01/17/20 1630 01/17/20 1755  BP: (!) 116/51  (!) 121/51 (!) 111/58  Pulse: (!) 55 (!) 51 (!) 53 (!) 51  Resp: 18 17 15 16   Temp:    98 F (36.7 C)  TempSrc:    Oral  SpO2: 96% 97% 96% 98%  Weight:      Height:       Eyes: PERRL, lids and conjunctivae normal; no icterus. ENMT: Mucous membranes are moist. Posterior pharynx clear of any exudate or lesions. Neck: normal, supple, no masses, no thyromegaly Respiratory: clear to auscultation bilaterally, no wheezing, no crackles. Normal respiratory effort. No accessory muscle use.  Good oxygen saturation on room air. Cardiovascular: Regular rate and rhythm, no murmurs / rubs / gallops. No extremity edema. 2+ pedal pulses. No carotid bruits.  Abdomen: no tenderness, no masses palpated. No hepatosplenomegaly. Bowel sounds positive.  No ascites appreciated. Musculoskeletal: no clubbing / cyanosis. No joint deformity upper and lower extremities. Good ROM, no contractures. Normal muscle tone.  Skin: no rashes, no petechiae. Neurologic: CN 2-12 grossly intact. Sensation intact, DTR normal. Strength 5/5 in all 4.  Psychiatric: Judgment and insight impaired secondary to underlying  dementia.  Normal mood.    Labs on Admission: I have personally reviewed following labs and imaging studies  CBC: Recent Labs  Lab 01/17/20 1240  WBC 2.7*  NEUTROABS 1.2*  HGB 7.6*  HCT 27.2*  MCV 78.2*  PLT 867    Basic Metabolic Panel: Recent Labs  Lab 01/17/20 1240  NA 138  K 3.5  CL 106  CO2 25  GLUCOSE 123*  BUN 12  CREATININE 0.78  CALCIUM 8.6*    GFR: Estimated Creatinine Clearance: 61.1 mL/min (by C-G formula based on SCr of 0.78 mg/dL).  Liver Function Tests: Recent Labs  Lab 01/17/20 1240  AST 31  ALT 14  ALKPHOS 53  BILITOT 0.8  PROT 5.9*  ALBUMIN 2.7*     Radiological Exams on Admission: No results found.  EKG: None  Assessment/Plan Acute on chronic blood loss anemia  in the setting of GI bleed -Patient with history of esophageal varices and cirrhosis -Concerns for maybe GAVE -GI service has been consulted -We will follow hemoglobin trend and transfuse 1 unit of PRBCs -Endoscopy evaluation anticipated on 01/18/2020 -Continue IV PPI and IV octreotide -Hemodynamically stable -Continue supportive care.  Microcytic anemia -In the setting of iron deficiency anemia secondary to chronic GI loss -Continue PPI -Follow hemoglobin trend and transfuse as needed -Outpatient follow-up with hematologist for iron infusion recommended.  Esophageal varices ([facility]) and Other cirrhosis of liver ([place]) -Continue GI service recommendations  Essential hypertension -Continue Norvasc -Vital signs within normal limits.  Hypothyroidism -Continue Synthroid.  Hyperlipidemia -Continue Lipitor.  History of dementia without behavioral disorder -Will continue the use of Aricept -Patient is a high risk for hospital-acquired delirium -Constant reorientation and supportive care to be provided.  DVT prophylaxis: SCDs. Code Status:   Full code Family Communication:  No family at bedside during evaluation. Disposition Plan:   Patient is  from:  Home  Anticipated DC to:  Home  Anticipated DC date:  01/18/20  Anticipated DC barriers: Stabilization of acute bleeding and low hemoglobin. Consults called:  Gastroenterology service Admission status:  Observation, MedSurg, length of stay more than 2 midnights.  Severity of Illness: Moderate severity; patient with history of cirrhosis and esophageal varices; actively followed by gastroenterology service due to acute on chronic anemia.  Capsule endoscopy demonstrated signs of ongoing acute bleeding with decrease in her hemoglobin level from baseline.  Patient has been brought to the hospital for transfusion and endoscopic evaluation.  IV PPI and IV octreotide has been started.  Follow hemoglobin trend.    Barton Dubois MD [facility]  How to contact the [facility] Attending or Consulting provider [place] or covering provider during after hours [place], for this patient?   1. Check the care team in [facility] and look for a) attending/consulting [facility] provider listed and b) the [facility] team listed 2. Log into [email or web address] and use [place]'s universal password to access. If you do not have the password, please contact the hospital operator. 3. Locate the [facility] provider you are looking for under [facility] and page to a number that you can be directly reached. 4. If you still have difficulty reaching the provider, please page the [facility] (Director on Call) for the Hospitalists listed on amion for assistance.  01/17/2020, 7:09 PM

## 2020-01-17 NOTE — Telephone Encounter (Signed)
Spoke with patient's daughter in law this morning to discuss early findings on Given's Capsule. Patient has fresh blood throughout her stomach. Also with bright red blood noted at the end of the study. I have not identified the source of bleeding as there is a significant amount of debris; this also makes it difficult to identify landmarks throughout the study. I have not yet completed the read, but I have recommended patient to be taken to the emergency room due to active bleeding and history of cirrhosis and recurrent significant anemia. Daughter in law reports some dark stools but nothing black and she hasn't seen any bright red blood, but states she doesn't always go to the bathroom with her mother in law who has dementia and may not report the bleeding.   I have called to update the ED charge nurse on current situation. 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 She will likely need to be started on octreotide and IV antibiotics and GI consult. May benefit from CTA to help localize bleeding. Will defer to workup in the ED and GI consult in the hospital.   I have also updated Dr. Laural Golden who is on call today.

## 2020-01-17 NOTE — H&P (View-Only) (Signed)
GI Inpatient Consult Note  Reason for Consult: GI bleed   History of Present Illness: Natalie Bridges is a 74 y.o. female seen for evaluation of GI bleed. PMHx of dementia, bradycardia, hypertension, stroke, hyperlipidemia, cirrhosis.   She is seen in the emergency room today after having a capsule endoscopy yesterday showing fresh blood throughout the stomach with bright red blood noted at the end of the study.  Study also had a significant amount of debris's involved.   Initially established care with [facility] July 2021 for anemia-at that point had 1 month of poor appetite nausea and diarrhea.  Hemoglobin had found to be down to 4 with MCV of 60.  She received 3 units of blood.  She had a colonoscopy showing 12 mm polyp, 6 mm polyp, random biopsies negative for microscopic colitis and infectious stool studies were negative.  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  She had additional labs again in October 2021 revealing a hemoglobin of 4.6 and was transfused 3 units of PRBCs.  Further work-up included a CT abdomen pelvis given the weight loss and poor appetite as well as the Giv[place] capsule yesterday as above.  Seen in emergency room accompanied by her daughter-in-law Lenna Sciara who provides all hx. Melissa notes patient has been sleeping a lot over the past week.  She denies any other specific complaints.  Patient has been unsteady and had a poor appetite.  Denies any complaints of abdominal pain, nausea or vomiting.  Appetite appears to be poor but denies postprandial abdominal pain or dysphagia, etc.  Continues to have 3-4 loose stools a day, denies any noted blood or dark stools.  Overall patient has lost about 5 to 7 pounds recently.  Denies any NSAID tobacco or alcohol intake.  No longer on iron or baby aspirin.   Last Colonoscopy: - One 12 mm polyp at the splenic flexure, removed with a hot snare. Resected and retrieved. Status post segmental  biopsy and stool sampling. - One 6 mm polyp in the sigmoid colon, removed with a cold snare. Resected and retrieved. Pathology with 2 tubular adenomas.  Random biopsies without microscopic colitis.  Last Endoscopy: 09/2019-3 columns of grade 2-3 esophageal varices found involving the distal 5 to 7 cm of the tubular esophagus. Disorgamized area of vascular ectatic lesion seen in the antrum. No typical portal gastropathy; no ulcer or infiltrating process. Patient did have scattered erosions with tiny white pustules involving the gastric mucosa of uncertain significance. Gastric biopsies taken. Findings: -Esophageal varices. Possible early G AVE. inflamed abnormal appearing gastric mucosa of uncertain significance?status post biopsy  Stomach biopsies negative for H. pylori.  Past Medical History:  Past Medical History:  Diagnosis Date  . Anemia   . Anxiety   . Bradycardia   . Dementia ([place])   . Essential hypertension   . History of stroke    x2  . Hyperlipemia   . Hypothyroidism   . Pancytopenia ([place])   . Urinary incontinence     Problem List: Patient Active Problem List   Diagnosis Date Noted  . Esophageal varices ([place]) 12/22/2019  . Microcytic anemia 09/13/2019  . Diarrhea 09/13/2019  . Loss of weight 09/13/2019    Past Surgical History: Past Surgical History:  Procedure Laterality Date  . COLONOSCOPY  2014  . COLONOSCOPY  09/18/2019   Dr. Gala Romney; 12 mm tubular adenoma, 6 mm tubular adenoma, random colon biopsies negative for microscopic colitis, C. difficile and GI pathogen panel  negative.  Repeat colonoscopy in 3 years if health permits.  . ESOPHAGOGASTRODUODENOSCOPY  09/18/2019   Dr. Gala Romney; 3 columns of grade 2-3 esophageal varices, disorganized area vascular ectatic lesion in the antrum, no portal gastropathy, ulcer, or infiltrating process.  Possible gave.  Scattered erosions with tiny white pustules involving gastric mucosa s/p biopsy revealing reactive gastropathy,  intestinal metaplasia, no dysplasia or H. pylori.  Marland Kitchen HEMORRHOID SURGERY    . STOMACH SURGERY  2010?   ????perforated ulcer  . TUBAL LIGATION      Allergies: Allergies  Allergen Reactions  . Codeine Nausea And Vomiting  . Hydrocodone Nausea And Vomiting    Home Medications: (Not in a hospital admission)  Home medication reconciliation was completed with the patient.   Scheduled Inpatient Medications:   . [START ON 01/21/2020] pantoprazole  40 mg Intravenous Q12H    Continuous Inpatient Infusions:   . sodium chloride 125 mL/hr at 01/17/20 1304  . octreotide  (SANDOSTATIN)    IV infusion 50 mcg/hr (01/17/20 1308)  . pantoprozole (PROTONIX) infusion    . pantoprazole (PROTONIX) 80 mg IVPB      PRN Inpatient Medications:    Family History: family history is not on file. She was adopted.    Social History:   reports that she has never smoked. She has never used smokeless tobacco. She reports that she does not drink alcohol and does not use drugs.   Review of Systems: Constitutional: +weight loss  Eyes: No changes in vision. ENT: No oral lesions, sore throat.  GI: see HPI.  Heme/Lymph: No easy bruising.  CV: No chest pain.  GU: No hematuria.  Integumentary: No rashes.  Neuro: No headaches.  Psych: No depression/anxiety.  Endocrine: No heat/cold intolerance.  Allergic/Immunologic: No urticaria.  Resp: No cough, SOB.  Musculoskeletal: No joint swelling.    Physical Examination: BP 108/62   Pulse (!) 56   Temp 98 F (36.7 C) (Tympanic)   Resp (!) 21   Ht 5\' 4"  (1.626 m)   Wt 74.8 kg   LMP  (LMP Unknown)   SpO2 95%   BMI 28.32 kg/m  Gen: NAD, alert and oriented x 2. No orientated to time. Answers questions w/ one word. HEENT: PEERLA, EOMI, Neck: supple, no JVD or thyromegaly Chest: CTA bilaterally, no wheezes, crackles, or other adventitious sounds CV: HR in 50s, no m/g/c/r Abd: soft, NT, ND, +BS in all four quadrants; no HSM, guarding, ridigity, or  rebound tenderness Ext: no edema, well perfused with 2+ pulses, Skin: no rash or lesions noted Lymph: no LAD  Data: Lab Results  Component Value Date   WBC 2.1 (L) 09/13/2019   HGB 8.8 (L) 09/13/2019   HCT 31.8 (L) 09/13/2019   MCV 71.6 (L) 09/13/2019   PLT 201 09/13/2019   No results for input(s): HGB in the last 168 hours. Lab Results  Component Value Date   NA 138 01/17/2020   K 3.5 01/17/2020   CL 106 01/17/2020   CO2 25 01/17/2020   BUN 12 01/17/2020   CREATININE 0.78 01/17/2020   Lab Results  Component Value Date   ALT 14 01/17/2020   AST 31 01/17/2020   ALKPHOS 53 01/17/2020   BILITOT 0.8 01/17/2020   No results for input(s): APTT, INR, PTT in the last 168 hours.  CT a/p 01/02/20 - IMPRESSION: 1. Cirrhosis with evidence of portal hypertension, small ascites, and splenomegaly. 2. Thickened appearance of the appendix may be related to underlying ascites and liver disease. Acute  appendicitis or an appendiceal lesion such as mucocele is not entirely excluded. Clinical correlation is recommended. If there is concern for appendiceal mass further evaluation with MRI is advised. 3. Hepatic colopathy versus mild colitis. Clinical correlation is recommended. 4. A 4 cm fusiform infrarenal abdominal aortic aneurysm. Recommend follow-up every 12 months and vascular consultation. This recommendation follows ACR consensus guidelines: White Paper of the ACR Incidental Findings Committee II on Vascular Findings. J Am Coll Radiol 2013; 10:789-794. 5. Two adjacent cysts or a septated cyst in the region of the right ovary. Further evaluation with ultrasound is advised. 6. Aortic Atherosclerosis (ICD10-I70.0).   Assessment/Plan: Ms. Clanton is a 74 y.o. female with past medical history of cirrhosis seen for evaluation of GI bleed.   1. 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 Hgb on admission  7.6. Plan for EGD with [facility] tomorrow with Dr. Daiva Nakayama. Continue PPI and octreotide drip in interim. Okay for clear liquids today then NPO at midnight. Bradycardia may limit beta blocker use in future. Also due for AFP  2. Dementia - overall significant decline in past few months. Orientated to person and place but not time. check ammonia to ensure HE not worsening.   3. Abnormal CT - thickened appearance of appendix and has appt as outpatient tomorrow w/ Dr Arnoldo Morale. I contacted his office to cancel appt at family request given will be admitted. Can reschedule after discharge. No RLQ pain.    Case was discussed with Dr. Laural Golden. Thank you for the consult. Please call with questions or concerns.  Laurine Blazer, PA-C [facility]

## 2020-01-17 NOTE — Telephone Encounter (Signed)
Routing to Manuela Schwartz, please request labs from [facility].

## 2020-01-17 NOTE — ED Provider Notes (Signed)
[facility] EMERGENCY DEPARTMENT Provider Note   CSN: [id number] Arrival date & time: 01/17/20  1113     History Chief Complaint  Patient presents with  . GI Bleeding    Natalie Bridges is a 74 y.o. female.  HPI   Patient presents with her daughter-in-law who provides all of the history as the patient has dementia, is minimally interactive.  Additional details obtained on chart review, as the patient was seen in the GI office today. Patient has a history of dementia, as above, but also gastric varices.  3 weeks ago the patient was anemic, requiring 3 units of blood.  Yesterday she started capsule endoscopy, and today on reviewing results was found to have substantial loss of blood in the stomach.  After discussing this with her gastroenterologist she was sent here for evaluation.  Daughter-in-law notes no interval fall, syncope, no substantial change from baseline interactivity which has been declining for the last several months.  Bowel movement pattern is unchanged, frequent loose brown stools throughout the day. Level 5 caveat secondary to dementia.  Past Medical History:  Diagnosis Date  . Anemia   . Anxiety   . Bradycardia   . Dementia ([place])   . Essential hypertension   . History of stroke    x2  . Hyperlipemia   . Hypothyroidism   . Pancytopenia ([place])   . Urinary incontinence     Patient Active Problem List   Diagnosis Date Noted  . Esophageal varices ([place]) 12/22/2019  . Microcytic anemia 09/13/2019  . Diarrhea 09/13/2019  . Loss of weight 09/13/2019    Past Surgical History:  Procedure Laterality Date  . COLONOSCOPY  2014  . COLONOSCOPY  09/18/2019   Dr. Gala Romney; 12 mm tubular adenoma, 6 mm tubular adenoma, random colon biopsies negative for microscopic colitis, C. difficile and GI pathogen panel negative.  Repeat colonoscopy in 3 years if health permits.  . ESOPHAGOGASTRODUODENOSCOPY  09/18/2019   Dr. Gala Romney; 3 columns of grade 2-3 esophageal varices,  disorganized area vascular ectatic lesion in the antrum, no portal gastropathy, ulcer, or infiltrating process.  Possible gave.  Scattered erosions with tiny white pustules involving gastric mucosa s/p biopsy revealing reactive gastropathy, intestinal metaplasia, no dysplasia or H. pylori.  Marland Kitchen HEMORRHOID SURGERY    . STOMACH SURGERY  2010?   ????perforated ulcer  . TUBAL LIGATION       OB History    Gravida  1   Para      Term      Preterm      AB      Living  1     SAB      TAB      Ectopic      Multiple      Live Births              Family History  Adopted: Yes  Problem Relation Age of Onset  . Colon cancer Neg Hx   . Inflammatory bowel disease Neg Hx   . Celiac disease Neg Hx   . Pancreatic cancer Neg Hx   . Liver cancer Neg Hx     Social History   Tobacco Use  . Smoking status: Never Smoker  . Smokeless tobacco: Never Used  Vaping Use  . Vaping Use: Never used  Substance Use Topics  . Alcohol use: No  . Drug use: No    Home Medications Prior to Admission medications   Medication Sig Start Date End Date Taking? Authorizing  Provider  amLODipine (NORVASC) 5 MG tablet Take 5 mg by mouth daily.    [provider]  aspirin 81 MG EC tablet Take 81 mg by mouth daily.  Patient not taking: Reported on 12/22/2019    [provider]  atorvastatin (LIPITOR) 10 MG tablet Take 10 mg by mouth daily.    [provider]  donepezil (ARICEPT) 10 MG tablet Take 10 mg by mouth at bedtime.    [provider]  levothyroxine (SYNTHROID) 25 MCG tablet Take 25 mcg by mouth daily before breakfast.    [provider]  Omega-3 Fatty Acids (FISH OIL) 1000 MG CAPS Take 1,000 mg by mouth daily.     [provider]  pantoprazole (PROTONIX) 40 MG tablet Take 40 mg by mouth daily.    [provider]  Potassium Chloride ER 20 MEQ TBCR Take 20 mEq by mouth in the morning and at bedtime. Please note this is an increased  dosage. Once this rx is complete, resume your previous 14meq daily. 09/14/19   Mahala Menghini, PA-C  risperiDONE (RISPERDAL) 0.5 MG tablet Take 0.5 mg by mouth daily. 09/06/19   [provider]    Allergies    Codeine and Hydrocodone  Review of Systems   Review of Systems  Unable to perform ROS: Dementia    Physical Exam Updated Vital Signs BP 106/67 (BP Location: Right Leg)   Pulse 63   Temp 98 F (36.7 C) (Tympanic)   Resp 18   Ht 5\' 4"  (1.626 m)   Wt 74.8 kg   LMP  (LMP Unknown)   SpO2 100%   BMI 28.32 kg/m   Physical Exam Vitals and nursing note reviewed.  Constitutional:      General: She is not in acute distress.    Appearance: She is well-developed. She is ill-appearing.  HENT:     Head: Normocephalic and atraumatic.  Eyes:     Conjunctiva/sclera: Conjunctivae normal.  Cardiovascular:     Rate and Rhythm: Normal rate and regular rhythm.  Pulmonary:     Effort: Pulmonary effort is normal. No respiratory distress.     Breath sounds: Normal breath sounds. No stridor.  Abdominal:     General: There is no distension.     Comments: Tender to palpation with pressure along the upper and right upper abdomen.  Skin:    General: Skin is warm and dry.  Neurological:     Mental Status: She is alert and oriented to person, place, and time.     Cranial Nerves: No cranial nerve deficit.  Psychiatric:        Behavior: Behavior is slowed and withdrawn.        Cognition and Memory: Cognition is impaired. Memory is impaired.      ED Results / Procedures / Treatments   Labs (all labs ordered are listed, but only abnormal results are displayed) Labs Reviewed  COMPREHENSIVE METABOLIC PANEL - Abnormal; Notable for the following components:      Result Value   Glucose, Bld 123 (*)    Calcium 8.6 (*)    Total Protein 5.9 (*)    Albumin 2.7 (*)    All other components within normal limits  CBC WITH DIFFERENTIAL/PLATELET - Abnormal; Notable for the following  components:   WBC 2.7 (*)    RBC 3.48 (*)    Hemoglobin 7.6 (*)    HCT 27.2 (*)    MCV 78.2 (*)    MCH 21.8 (*)  MCHC 27.9 (*)    RDW 24.3 (*)    Neutro Abs 1.2 (*)    All other components within normal limits  RESP PANEL BY RT PCR (RSV, FLU A&B, COVID)  LIPASE, BLOOD  PROTIME-INR  ETHANOL  TYPE AND SCREEN    Procedures Procedures (including critical care time)  Medications Ordered in ED Medications  0.9 %  sodium chloride infusion ( Intravenous New Bag/Given 01/17/20 1304)  octreotide (SANDOSTATIN) 2 mcg/mL load via infusion 50 mcg (50 mcg Intravenous Bolus from Bag 01/17/20 1308)    And  octreotide (SANDOSTATIN) 500 mcg in sodium chloride 0.9 % 250 mL (2 mcg/mL) infusion (50 mcg/hr Intravenous New Bag/Given 01/17/20 1308)  pantoprazole (PROTONIX) 80 mg in sodium chloride 0.9 % 100 mL IVPB (has no administration in time range)  pantoprazole (PROTONIX) 80 mg in sodium chloride 0.9 % 100 mL (0.8 mg/mL) infusion (has no administration in time range)  pantoprazole (PROTONIX) injection 40 mg (has no administration in time range)    ED Course  I have reviewed the triage vital signs and the nursing notes.  Pertinent labs & imaging results that were available during my care of the patient were reviewed by me and considered in my medical decision making (see chart for details).  With consideration of ongoing GI bleed given the patient's history, acknowledging complication of dementia, the patient had labs sent, started on octreotide drip, and have consulted our GI colleagues. Patient was placed on continuous cardiac, pulse oximetry.   Initial blood pressure appropriate, sinus rhythm, rate 80s unremarkable on monitor, pulse oximetry 99% room air unremarkable  Update: I discussed patient's case with our gastroenterologist on-call, we reviewed her gastric results from endoscopic camera, concerning for bleed from the antrum of the stomach, varices. Patient requires ongoing  octreotide and Protonix.  Update:, Vital signs remain unremarkable.  Labs notable for anemia, 7.6, and the patient has had type and screen prepared.  However, without hemodynamic changes, after initiation of octreotide, pantoprazole, transfusion not currently indicated, with anticipation of EGD tomorrow.  I discussed the patient's case with our internal medicine colleague, patient will be admitted for ongoing management, with GI consultation.    MDM Rules/Calculators/A&P MDM Number of Diagnoses or Management Options Upper GI bleed: new, needed workup   Amount and/or Complexity of Data Reviewed Clinical lab tests: reviewed Tests in the radiology section of CPT: reviewed Tests in the medicine section of CPT: reviewed Discussion of test results with the performing providers: yes Decide to obtain previous medical records or to obtain history from someone other than the patient: yes Obtain history from someone other than the patient: yes Review and summarize past medical records: yes Discuss the patient with other providers: yes  Risk of Complications, Morbidity, and/or Mortality Presenting problems: high Diagnostic procedures: high Management options: high  Critical Care Total time providing critical care: 30-74 minutes (35)  Patient Progress Patient progress: stable  Final Clinical Impression(s) / ED Diagnoses Final diagnoses:  Upper GI bleed      Carmin Muskrat, MD 01/17/20 1437

## 2020-01-17 NOTE — ED Triage Notes (Signed)
Send by Dr Gala Romney office for possible GI bleed. Loose dark brown stool, three in the last 24 hours.  Denies any pain at this time.

## 2020-01-18 ENCOUNTER — Observation Stay (HOSPITAL_COMMUNITY): Payer: Medicare Other | Admitting: Certified Registered"

## 2020-01-18 ENCOUNTER — Encounter (HOSPITAL_COMMUNITY)
Admission: EM | Disposition: A | Discharge status: Home / Self Care | Payer: Self-pay | Source: Home / Self Care | Attending: Internal Medicine

## 2020-01-18 ENCOUNTER — Encounter (HOSPITAL_COMMUNITY): Payer: Self-pay | Admitting: Internal Medicine

## 2020-01-18 ENCOUNTER — Ambulatory Visit: Payer: Self-pay | Admitting: General Surgery

## 2020-01-18 DIAGNOSIS — I8510 Secondary esophageal varices without bleeding: Secondary | ICD-10-CM | POA: Diagnosis not present

## 2020-01-18 DIAGNOSIS — I10 Essential (primary) hypertension: Secondary | ICD-10-CM | POA: Diagnosis present

## 2020-01-18 DIAGNOSIS — D62 Acute posthemorrhagic anemia: Secondary | ICD-10-CM | POA: Diagnosis not present

## 2020-01-18 DIAGNOSIS — I8500 Esophageal varices without bleeding: Secondary | ICD-10-CM | POA: Diagnosis not present

## 2020-01-18 DIAGNOSIS — K3189 Other diseases of stomach and duodenum: Secondary | ICD-10-CM | POA: Diagnosis not present

## 2020-01-18 DIAGNOSIS — K766 Portal hypertension: Secondary | ICD-10-CM | POA: Diagnosis not present

## 2020-01-18 DIAGNOSIS — Z79890 Hormone replacement therapy (postmenopausal): Secondary | ICD-10-CM | POA: Diagnosis not present

## 2020-01-18 DIAGNOSIS — F0390 Unspecified dementia without behavioral disturbance: Secondary | ICD-10-CM | POA: Diagnosis present

## 2020-01-18 DIAGNOSIS — K31811 Angiodysplasia of stomach and duodenum with bleeding: Secondary | ICD-10-CM | POA: Diagnosis not present

## 2020-01-18 DIAGNOSIS — Z79899 Other long term (current) drug therapy: Secondary | ICD-10-CM | POA: Diagnosis not present

## 2020-01-18 DIAGNOSIS — T183XXA Foreign body in small intestine, initial encounter: Secondary | ICD-10-CM | POA: Diagnosis not present

## 2020-01-18 DIAGNOSIS — Z7982 Long term (current) use of aspirin: Secondary | ICD-10-CM | POA: Diagnosis not present

## 2020-01-18 DIAGNOSIS — E039 Hypothyroidism, unspecified: Secondary | ICD-10-CM | POA: Diagnosis present

## 2020-01-18 DIAGNOSIS — K922 Gastrointestinal hemorrhage, unspecified: Secondary | ICD-10-CM | POA: Diagnosis not present

## 2020-01-18 DIAGNOSIS — T189XXD Foreign body of alimentary tract, part unspecified, subsequent encounter: Secondary | ICD-10-CM | POA: Diagnosis not present

## 2020-01-18 DIAGNOSIS — Z8673 Personal history of transient ischemic attack (TIA), and cerebral infarction without residual deficits: Secondary | ICD-10-CM | POA: Diagnosis not present

## 2020-01-18 DIAGNOSIS — E785 Hyperlipidemia, unspecified: Secondary | ICD-10-CM | POA: Diagnosis present

## 2020-01-18 DIAGNOSIS — Z20822 Contact with and (suspected) exposure to covid-19: Secondary | ICD-10-CM | POA: Diagnosis present

## 2020-01-18 DIAGNOSIS — D509 Iron deficiency anemia, unspecified: Secondary | ICD-10-CM | POA: Diagnosis not present

## 2020-01-18 DIAGNOSIS — K7469 Other cirrhosis of liver: Secondary | ICD-10-CM | POA: Diagnosis present

## 2020-01-18 DIAGNOSIS — Z885 Allergy status to narcotic agent status: Secondary | ICD-10-CM | POA: Diagnosis not present

## 2020-01-18 HISTORY — PX: ESOPHAGOGASTRODUODENOSCOPY (EGD) WITH PROPOFOL: SHX5813

## 2020-01-18 LAB — CBC
HCT: 28.2 % — ABNORMAL LOW (ref 36.0–46.0)
Hemoglobin: 8 g/dL — ABNORMAL LOW (ref 12.0–15.0)
MCH: 22.5 pg — ABNORMAL LOW (ref 26.0–34.0)
MCHC: 28.4 g/dL — ABNORMAL LOW (ref 30.0–36.0)
MCV: 79.4 fL — ABNORMAL LOW (ref 80.0–100.0)
Platelets: 186 10*3/uL (ref 150–400)
RBC: 3.55 MIL/uL — ABNORMAL LOW (ref 3.87–5.11)
RDW: 22.9 % — ABNORMAL HIGH (ref 11.5–15.5)
WBC: 2.7 10*3/uL — ABNORMAL LOW (ref 4.0–10.5)
nRBC: 0 % (ref 0.0–0.2)

## 2020-01-18 SURGERY — ESOPHAGOGASTRODUODENOSCOPY (EGD) WITH PROPOFOL
Anesthesia: General

## 2020-01-18 MED ORDER — SODIUM CHLORIDE 0.9 % IV SOLN
2.0000 g | INTRAVENOUS | Status: DC
  Administered 2020-01-18 – 2020-01-19 (×2): 2 g via INTRAVENOUS
  Filled 2020-01-18 (×2): qty 20

## 2020-01-18 MED ORDER — SODIUM CHLORIDE 0.9 % IV SOLN: INTRAVENOUS | Status: DC

## 2020-01-18 MED ORDER — STERILE WATER FOR IRRIGATION IR SOLN
Status: DC | PRN
  Administered 2020-01-18: 100 mL

## 2020-01-18 MED ORDER — LACTATED RINGERS IV SOLN: INTRAVENOUS | Status: DC | PRN

## 2020-01-18 MED ORDER — PROPOFOL 10 MG/ML IV BOLUS
INTRAVENOUS | Status: DC | PRN
  Administered 2020-01-18: 150 ug/kg/min via INTRAVENOUS
  Administered 2020-01-18: 80 mg via INTRAVENOUS

## 2020-01-18 NOTE — Op Note (Addendum)
[facility] Patient Name: Natalie Bridges Procedure Date: 01/18/2020 2:44 PM MRN: [id number] Date of Birth: 1945/07/25 Attending MD: Norvel Richards , MD CSN: [id number] Age: 74 Admit Type: Inpatient Procedure:                Upper GI endoscopy Indications:              Active gastrointestinal bleeding seen on capsule Providers:                Norvel Richards, MD, Lambert Mody,                            Raphael Gibney, Technician Referring MD:              Medicines:                Propofol per Anesthesia Complications:            No immediate complications. Estimated Blood Loss:     Estimated blood loss: none. Procedure:                Pre-Anesthesia Assessment:                           - Prior to the procedure, a History and Physical                            was performed, and patient medications and                            allergies were reviewed. The patient's tolerance of                            previous anesthesia was also reviewed. The risks                            and benefits of the procedure and the sedation                            options and risks were discussed with the patient.                            All questions were answered, and informed consent                            was obtained. Prior Anticoagulants: The patient has                            taken no previous anticoagulant or antiplatelet                            agents. ASA Grade Assessment: III - A patient with                            severe systemic disease. After reviewing the risks  and benefits, the patient was deemed in                            satisfactory condition to undergo the procedure.                           After obtaining informed consent, the endoscope was                            passed under direct vision. Throughout the                            procedure, the patient's blood pressure, pulse, and                             oxygen saturations were monitored continuously. The                            [id number]-[id number] ([id number]) scope was introduced through the                            mouth, and advanced to the second part of duodenum.                            The upper GI endoscopy was accomplished without                            difficulty. The patient tolerated the procedure                            well. Scope In: 3:02:41 PM Scope Out: 3:14:20 PM Total Procedure Duration: 0 hours 11 minutes 39 seconds  Findings:      2 columns grade 2 and one column grade 3 esophageal varices involving       the distal 6 7 cm of esophagus. No bleeding stigmata. EG junction easily       traversed      Somewhat polypoid appearing congested gastric mucosa consistent with       portal gastropathy. Intense vascular ectatic changes involving the       antrum with areas of spontaneous oozing seen. Also, a single area of       spontaneous oozing seen in the gastric body. Single nonbleeding varix at       the cardia seen best on retroflexion.      Pylorus patent. Normal-appearing first and second portion of the duodenum      Utilizing the Erbie unit, I performed APC ablation of the ectatic       vascular areas in the antrum several applications. The area of oozing       mucosa gastric body also ablated. Patient tolerated the procedure well       with the essentially no bleeding from the procedure. Good hemostasis       achieved. Impression:               -Nonbleeding esophageal varices. Portal  hypertensive gastropathy. GAVE with oozing. Status                            post APC ablation. Moderate Sedation:      Moderate (conscious) sedation was personally administered by an       anesthesia professional. The following parameters were monitored: oxygen       saturation, heart rate, blood pressure, respiratory rate, EKG, adequacy       of pulmonary ventilation, and response to  care. Recommendation:           - Return patient to hospital ward for observation.                           - Clear liquid diet. Complete 5-day course of                            antibiotics. PPI twice daily. PPI infusion and                            octreotide                           - Continue present medications. We will reassess                            tomorrow morning.                           - Return to my office in 1 week. Procedure Code(s):        --- Professional ---                           [phone], Esophagogastroduodenoscopy, flexible,                            transoral; diagnostic, including collection of                            specimen(s) by brushing or washing, when performed                            (separate procedure) Diagnosis Code(s):        --- Professional ---                           K92.2, Gastrointestinal hemorrhage, unspecified CPT copyright 2019 American Medical Association. All rights reserved. The codes documented in this report are preliminary and upon coder review may  be revised to meet current compliance requirements. Cristopher Estimable. Jamauri Kruzel, MD Norvel Richards, MD 01/18/2020 3:57:44 PM This report has been signed electronically. Number of Addenda: 0

## 2020-01-18 NOTE — Anesthesia Postprocedure Evaluation (Signed)
Anesthesia Post Note  Patient: Korah Hufstedler  Procedure(s) Performed: ESOPHAGOGASTRODUODENOSCOPY (EGD) WITH PROPOFOL (N/A )  Patient location during evaluation: PACU Anesthesia Type: General Level of consciousness: awake, oriented, awake and alert and patient cooperative Pain management: pain level controlled Vital Signs Assessment: post-procedure vital signs reviewed and stable Respiratory status: spontaneous breathing, respiratory function stable and nonlabored ventilation Cardiovascular status: blood pressure returned to baseline and stable Postop Assessment: no headache and no backache Anesthetic complications: no   No complications documented.   Last Vitals:  Vitals:   01/18/20 1352 01/18/20 1403  BP:  (!) 145/59  Pulse:  (!) 52  Resp:  20  Temp: 36.7 C 36.7 C  SpO2:  94%    Last Pain:  Vitals:   01/18/20 1458  TempSrc:   PainSc: 0-No pain                 Tacy Learn

## 2020-01-18 NOTE — Interval H&P Note (Signed)
History and Physical Interval Note:  01/18/2020 2:55 PM  Natalie Bridges  has presented today for surgery, with the diagnosis of Gastric antral vascular ectasia with bleeding..  The various methods of treatment have been discussed with the patient and family. After consideration of risks, benefits and other options for treatment, the patient has consented to  Procedure(s): ESOPHAGOGASTRODUODENOSCOPY (EGD) WITH PROPOFOL (N/A) as a surgical intervention.  The patient's history has been reviewed, patient examined, no change in status, stable for surgery.  I have reviewed the patient's chart and labs.  Questions were answered to the patient's satisfaction.     Manus Rudd  Patient seen and examined in short stay.  Active bleeding seen on capsule study (stomach).  Consequently, EGD now being done with therapeutic intent as feasible/appropriate per plan. The risks, benefits, limitations, alternatives and imponderables have been reviewed with the patient. Potential for esophageal dilation, biopsy, etc. have also been reviewed.  Questions have been answered. All parties agreeable.

## 2020-01-18 NOTE — Progress Notes (Signed)
Reviewed chart. Patient on for EGD later today due to active bleeding noted on capsule, ?from GAVE. She is on PPI infusion and octreotide. Will add IV rocephin 2 grams daily for SBP prophylaxis.   She will need KUB in few days to make sure small bowel capsule passed.  Would consider outpatient hematology referral to assist with IDA/iron infusion needs.   Laureen Ochs. Bernarda Caffey [facility] [phone] 11/11/20218:23 AM

## 2020-01-18 NOTE — Care Management Obs Status (Signed)
[facility] NOTIFICATION   Patient Details  Name: Natalie Bridges MRN: [id number] Date of Birth: [birth year]   Medicare Observation Status Notification Given:  Yes    Natasha Bence, LCSW 01/18/2020, 4:25 PM

## 2020-01-18 NOTE — Anesthesia Preprocedure Evaluation (Addendum)
Anesthesia Evaluation  Patient identified by MRN, date of birth, ID band Patient awake    Reviewed: Allergy & Precautions, NPO status , Patient's Chart, lab work & pertinent test results  History of Anesthesia Complications Negative for: history of anesthetic complications  Airway Mallampati: II  TM Distance: >3 FB Neck ROM: Full    Dental  (+) Edentulous Upper, Edentulous Lower   Pulmonary neg pulmonary ROS,    Pulmonary exam normal breath sounds clear to auscultation       Cardiovascular Exercise Tolerance: Good hypertension, Pt. on medications Normal cardiovascular exam Rhythm:Regular Rate:Normal     Neuro/Psych PSYCHIATRIC DISORDERS Anxiety negative neurological ROS     GI/Hepatic negative GI ROS, Neg liver ROS,   Endo/Other  Hypothyroidism   Renal/GU negative Renal ROS     Musculoskeletal negative musculoskeletal ROS (+)   Abdominal   Peds  Hematology  (+) anemia ,   Anesthesia Other Findings   Reproductive/Obstetrics negative OB ROS                            Anesthesia Physical Anesthesia Plan  ASA: III  Anesthesia Plan: General   Post-op Pain Management:    Induction: Intravenous  PONV Risk Score and Plan: TIVA  Airway Management Planned: Nasal Cannula and Natural Airway  Additional Equipment:   Intra-op Plan:   Post-operative Plan:   Informed Consent: I have reviewed the patients History and Physical, chart, labs and discussed the procedure including the risks, benefits and alternatives for the proposed anesthesia with the patient or authorized representative who has indicated his/her understanding and acceptance.       Plan Discussed with: CRNA and Surgeon  Anesthesia Plan Comments:         Anesthesia Quick Evaluation

## 2020-01-18 NOTE — Transfer of Care (Signed)
Immediate Anesthesia Transfer of Care Note  Patient: Natalie Bridges  Procedure(s) Performed: ESOPHAGOGASTRODUODENOSCOPY (EGD) WITH PROPOFOL (N/A )  Patient Location: PACU  Anesthesia Type:General  Level of Consciousness: awake, alert , oriented and patient cooperative  Airway & Oxygen Therapy: Patient Spontanous Breathing  Post-op Assessment: Report given to RN, Post -op Vital signs reviewed and stable and Patient moving all extremities  Post vital signs: Reviewed and stable  Last Vitals:  Vitals Value Taken Time  BP 122/61 01/18/20 1519  Temp    Pulse 61 01/18/20 1520  Resp 25 01/18/20 1520  SpO2 93 % 01/18/20 1520  Vitals shown include unvalidated device data.  Last Pain:  Vitals:   01/18/20 1458  TempSrc:   PainSc: 0-No pain         Complications: No complications documented.

## 2020-01-18 NOTE — Progress Notes (Signed)
PROGRESS NOTE    Natalie Bridges  HUT:[id number] DOB: 27-Dec-1945 DOA: 01/17/2020 PCP: Neale Burly, MD   Chief Complaint  Patient presents with  . GI Bleeding    Brief Narrative:  Natalie Bridges is a 74 y.o. female with medical history significant of chronic anemia, essential hypertension, hyperlipidemia, hypothyroidism, nonalcoholic liver cirrhosis, hx of GAVE and esophageal varices; who presented to the hospital secondary to acute on chronic anemia with findings on recent capsule endoscopy for active GI bleed.  Patient denies chest pain, shortness of breath, nausea, vomiting or abdominal discomfort.  At home there was description of slightly darker appearance in her stools but no frank melanotic changes.  There is no focal neurologic deficits, no complaints of URI symptoms, no dysuria, no sick contacts, no fever chills or any other complaints.  Overall history was limited due to patient underlying dementia.  ED Course: Hemoglobin 7.6, hemodynamically stable.  No chest pain, no active nausea vomiting.  2 large IV ports in place.  Patient typed and screened.  Started on IV PPI and octreotide.  GI service consulted and [facility] contacted to place patient in the hospital for further evaluation and management.   Assessment & Plan: 1-acute on chronic blood loss anemiacute on chronic blood loss anemia in the setting of GI bleed -Patient with underlying history of esophageal varices and cirrhosis -In the setting of high concern for gave -Endoscopy evaluation planned for later today -Continue IV PPI and IV octreotide -Status post 1 unit of PRBC -Follow hemoglobin trend -Follow GI service recommendation -SBP prophylaxis using Rocephin. -Remains n.p.o. for now.  2-history of dementia without behavioral disorder -Continue the use of Aricept -Continue constant reorientation.  3-hypothyroidism -Continue Synthroid.  4-hypertension -Continue Norvasc -Vital signs  stable  5-hyperlipidemia -Continue Lipitor  DVT prophylaxis: SCDs. Code Status: Full code. Family Communication: No family at bedside.  Son updated over the phone 01/18/2020. Disposition:   Status is: Inpatient  Dispo: The patient is from: Home              Anticipated d/c is to: Home              Anticipated d/c date is: To be determined.              Patient currently no medically stable for discharge.  Anticipated endoscopic evaluation later today for further evaluation and treatment of acute on chronic GI bleed.  After 1 unit of PRBCs hemoglobin is stable 8.0.  No operative bleeding reported.  Will follow GI service recommendations.  Continue Rocephin for SBP prophylaxis.  Continue PPI and octreotide.      Consultants:   GI service   Procedures:  EGD planned for later today (01/18/2020)   Antimicrobials:  Rocephin for SBP prophylaxis.   Subjective: In no major distress, reports no chest pain, no nausea, no vomiting, no shortness of breath.  No overt bleeding overnight.  Objective: Vitals:   01/18/20 1520 01/18/20 1530 01/18/20 1600 01/18/20 1611  BP: 122/61 133/60 (!) 153/103   Pulse: 61 (!) 50 (!) 50 66  Resp: (!) 25 (!) 21 13 20   Temp: 98.5 F (36.9 C)     TempSrc:      SpO2: 93% 93% 93% 93%  Weight:      Height:        Intake/Output Summary (Last 24 hours) at 01/18/2020 1632 Last data filed at 01/18/2020 1513 Gross per 24 hour  Intake 555.82 ml  Output 150 ml  Net 405.82 ml  Filed Weights   01/17/20 1133  Weight: 74.8 kg    Examination:  General exam: Appears calm and comfortable; denies chest pain, no nausea, no vomiting, no shortness of breath.  No overt bleeding reported overnight.  Patient expressed to be hungry.  Currently n.p.o. in anticipation for endoscopic evaluation. Respiratory system: Clear to auscultation bilaterally; no rubs, no gallops, good oxygen saturation on room air. Cardiovascular system: Rate controlled, no rubs, no  gallops. Gastrointestinal system: Abdomen is nondistended, soft and nontender. No organomegaly or masses felt. Normal bowel sounds heard. Central nervous system: Alert and oriented x2. No focal neurological deficits. Extremities: Cyanosis or clubbing. Skin: No rashes, no petechiae. Psychiatry: Mood & affect appropriate.     Data Reviewed: I have personally reviewed following labs and imaging studies  CBC: Recent Labs  Lab 01/17/20 1240 01/18/20 0443  WBC 2.7* 2.7*  NEUTROABS 1.2*  --   HGB 7.6* 8.0*  HCT 27.2* 28.2*  MCV 78.2* 79.4*  PLT 201 381    Basic Metabolic Panel: Recent Labs  Lab 01/17/20 1240  NA 138  K 3.5  CL 106  CO2 25  GLUCOSE 123*  BUN 12  CREATININE 0.78  CALCIUM 8.6*    GFR: Estimated Creatinine Clearance: 61.1 mL/min (by C-G formula based on SCr of 0.78 mg/dL).  Liver Function Tests: Recent Labs  Lab 01/17/20 1240  AST 31  ALT 14  ALKPHOS 53  BILITOT 0.8  PROT 5.9*  ALBUMIN 2.7*    CBG: No results for input(s): GLUCAP in the last 168 hours.   Recent Results (from the past 240 hour(s))  Resp Panel by RT PCR (RSV, Flu A&B, Covid) -     Status: None   Collection Time: 01/17/20  1:40 PM  Result Value Ref Range Status   SARS Coronavirus 2 by RT PCR NEGATIVE NEGATIVE Final    Comment: (NOTE) SARS-CoV-2 target nucleic acids are NOT DETECTED.  The SARS-CoV-2 RNA is generally detectable in upper respiratoy specimens during the acute phase of infection. The lowest concentration of SARS-CoV-2 viral copies this assay can detect is 131 copies/mL. A negative result does not preclude SARS-Cov-2 infection and should not be used as the sole basis for treatment or other patient management decisions. A negative result may occur with  improper specimen collection/handling, submission of specimen other than nasopharyngeal swab, presence of viral mutation(s) within the areas targeted by this assay, and inadequate number of viral copies (<131  copies/mL). A negative result must be combined with clinical observations, patient history, and epidemiological information. The expected result is Negative.  Fact Sheet for Patients:  PinkCheek.be  Fact Sheet for Healthcare Providers:  GravelBags.it  This test is no t yet approved or cleared by the [place] FDA and  has been authorized for detection and/or diagnosis of SARS-CoV-2 by FDA under an Emergency Use Authorization (EUA). This EUA will remain  in effect (meaning this test can be used) for the duration of the COVID-19 declaration under Section 564(b)(1) of the Act, 21 U.S.C. section 360bbb-3(b)(1), unless the authorization is terminated or revoked sooner.     Influenza A by PCR NEGATIVE NEGATIVE Final   Influenza B by PCR NEGATIVE NEGATIVE Final    Comment: (NOTE) The Xpert Xpress SARS-CoV-2/FLU/RSV assay is intended as an aid in  the diagnosis of influenza from Nasopharyngeal swab specimens and  should not be used as a sole basis for treatment. Nasal washings and  aspirates are unacceptable for Xpert Xpress SARS-CoV-2/FLU/RSV  testing.  Fact Sheet  for Patients: PinkCheek.be  Fact Sheet for Healthcare Providers: GravelBags.it  This test is not yet approved or cleared by the [place] FDA and  has been authorized for detection and/or diagnosis of SARS-CoV-2 by  FDA under an Emergency Use Authorization (EUA). This EUA will remain  in effect (meaning this test can be used) for the duration of the  Covid-19 declaration under Section 564(b)(1) of the Act, 21  U.S.C. section 360bbb-3(b)(1), unless the authorization is  terminated or revoked.    Respiratory Syncytial Virus by PCR NEGATIVE NEGATIVE Final    Comment: (NOTE) Fact Sheet for Patients: PinkCheek.be  Fact Sheet for Healthcare  Providers: GravelBags.it  This test is not yet approved or cleared by the [place] FDA and  has been authorized for detection and/or diagnosis of SARS-CoV-2 by  FDA under an Emergency Use Authorization (EUA). This EUA will remain  in effect (meaning this test can be used) for the duration of the  COVID-19 declaration under Section 564(b)(1) of the Act, 21 U.S.C.  section 360bbb-3(b)(1), unless the authorization is terminated or  revoked. Performed at [facility], [address] [place], [place] [address]       Radiology Studies: No results found.   Scheduled Meds: . sodium chloride   Intravenous Once  . amLODipine  5 mg Oral Daily  . atorvastatin  10 mg Oral Daily  . donepezil  10 mg Oral QHS  . levothyroxine  25 mcg Oral QAC breakfast  . [START ON 01/21/2020] pantoprazole  40 mg Intravenous Q12H  . risperiDONE  0.5 mg Oral Daily   Continuous Infusions: . sodium chloride 75 mL/hr at 01/18/20 1046  . cefTRIAXone (ROCEPHIN)  IV 2 g (01/18/20 1047)     LOS: 0 days    Time spent: 30 minutes    Barton Dubois, MD [facility]   To contact the attending provider between 7A-7P or the covering provider during after hours 7P-7A, please log into the web site [email or web address] and access using universal [place] password for that web site. If you do not have the password, please call the hospital operator.  01/18/2020, 4:32 PM

## 2020-01-19 ENCOUNTER — Telehealth: Payer: Self-pay | Admitting: Nurse Practitioner

## 2020-01-19 ENCOUNTER — Inpatient Hospital Stay (HOSPITAL_COMMUNITY): Payer: Medicare Other

## 2020-01-19 DIAGNOSIS — T189XXA Foreign body of alimentary tract, part unspecified, initial encounter: Secondary | ICD-10-CM

## 2020-01-19 DIAGNOSIS — K922 Gastrointestinal hemorrhage, unspecified: Secondary | ICD-10-CM | POA: Diagnosis not present

## 2020-01-19 DIAGNOSIS — D62 Acute posthemorrhagic anemia: Secondary | ICD-10-CM | POA: Diagnosis not present

## 2020-01-19 DIAGNOSIS — K7469 Other cirrhosis of liver: Secondary | ICD-10-CM | POA: Diagnosis not present

## 2020-01-19 DIAGNOSIS — T189XXD Foreign body of alimentary tract, part unspecified, subsequent encounter: Secondary | ICD-10-CM | POA: Diagnosis not present

## 2020-01-19 LAB — TYPE AND SCREEN
ABO/RH(D): A POS
Antibody Screen: NEGATIVE
Unit division: 0

## 2020-01-19 LAB — BPAM RBC
Blood Product Expiration Date: 202112062359
ISSUE DATE / TIME: 202111110000
Unit Type and Rh: 6200

## 2020-01-19 MED ORDER — PANTOPRAZOLE SODIUM 40 MG PO TBEC: 40.0000 mg | DELAYED_RELEASE_TABLET | Freq: Two times a day (BID) | ORAL | Status: DC

## 2020-01-19 MED ORDER — NIFEREX PO TABS: 1.0000 | ORAL_TABLET | Freq: Every day | ORAL | 0 refills | Status: AC

## 2020-01-19 MED ORDER — PANTOPRAZOLE SODIUM 40 MG IV SOLR
40.0000 mg | Freq: Two times a day (BID) | INTRAVENOUS | Status: DC
  Administered 2020-01-19: 40 mg via INTRAVENOUS
  Filled 2020-01-19: qty 40

## 2020-01-19 MED ORDER — PANTOPRAZOLE SODIUM 40 MG PO TBEC: 40.0000 mg | DELAYED_RELEASE_TABLET | Freq: Two times a day (BID) | ORAL | 1 refills | Status: AC

## 2020-01-19 MED ORDER — CEFDINIR 300 MG PO CAPS: 300.0000 mg | ORAL_CAPSULE | Freq: Two times a day (BID) | ORAL | 0 refills | Status: AC

## 2020-01-19 NOTE — Care Management Important Message (Signed)
Important Message  Patient Details  Name: Natalie Bridges MRN: [id number] Date of Birth: 24-May-1945   Medicare Important Message Given:        Tommy Medal 01/19/2020, 2:23 PM

## 2020-01-19 NOTE — Interval H&P Note (Signed)
History and Physical Interval Note:  01/19/2020 11:58 AM  Natalie Bridges  has presented today for surgery, with the diagnosis of Gastric antral vascular ectasia with bleeding..  The various methods of treatment have been discussed with the patient and family. After consideration of risks, benefits and other options for treatment, the patient has consented to  Procedure(s): ESOPHAGOGASTRODUODENOSCOPY (EGD) WITH PROPOFOL (N/A) as a surgical intervention.  The patient's history has been reviewed, patient examined, no change in status, stable for surgery.  I have reviewed the patient's chart and labs.  Questions were answered to the patient's satisfaction.     Dabney Schanz  No change.  EGD per plan.  The risks, benefits, limitations, alternatives and imponderables have been reviewed with the patient. Potential for esophageal dilation, biopsy, etc. have also been reviewed.  Questions have been answered. All parties agreeable.

## 2020-01-19 NOTE — Progress Notes (Signed)
Subjective: Accompanied by daughter at the bedside. Denies abdominal pain, N/V. No bowel movement today, cannot remember if she had one yesterday. No overt GI complaints.  Objective: Vital signs in last 24 hours: Temp:  [97.6 F (36.4 C)-98.5 F (36.9 C)] 98 F (36.7 C) (11/12 0500) Pulse Rate:  [50-81] 81 (11/12 0500) Resp:  [13-25] 16 (11/12 0500) BP: (122-157)/(54-103) 143/54 (11/12 0500) SpO2:  [92 %-97 %] 93 % (11/12 0500)   General:   Alert and oriented, pleasant Head:  Normocephalic and atraumatic. Eyes:  No icterus, sclera clear. Conjuctiva pink.  Heart:  S1, S2 present, no murmurs noted.  Lungs: Clear to auscultation bilaterally, without wheezing, rales, or rhonchi.  Abdomen:  Bowel sounds present, soft, non-tender, non-distended. No HSM or hernias noted. No rebound or guarding. Msk:  Symmetrical without gross deformities. Pulses:  Normal bilateral DP pulses noted. Extremities:  Without clubbing or edema. Neurologic:  Alert and  oriented x4;  grossly normal neurologically. Psych:  Alert and cooperative. Normal mood and affect.  Intake/Output from previous day: 11/11 0701 - 11/12 0700 In: 3505.3 [P.O.:240; I.V.:3165.3; IV Piggyback:100] Out: 150 [Urine:150] Intake/Output this shift: No intake/output data recorded.  Lab Results: Recent Labs    01/17/20 1240 01/18/20 0443  WBC 2.7* 2.7*  HGB 7.6* 8.0*  HCT 27.2* 28.2*  PLT 201 186   BMET Recent Labs    01/17/20 1240  NA 138  K 3.5  CL 106  CO2 25  GLUCOSE 123*  BUN 12  CREATININE 0.78  CALCIUM 8.6*   LFT Recent Labs    01/17/20 1240  PROT 5.9*  ALBUMIN 2.7*  AST 31  ALT 14  ALKPHOS 53  BILITOT 0.8   PT/INR Recent Labs    01/17/20 1240  LABPROT 15.0  INR 1.2   Hepatitis Panel No results for input(s): HEPBSAG, HCVAB, HEPAIGM, HEPBIGM in the last 72 hours.   Studies/Results: No results found.  Assessment: Natalie Bridges is a 74 y.o. female with past medical history of cirrhosis  seen for evaluation of GI bleed.  History of esophageal varices and GAVE.  Capsule endoscopy completed 01/16/2020 which showed significant blood pooling.  Subsequently referred for admission and inpatient GI evaluation including procedures.  SARS-CoV-2 normal.  Acute GI bleed: Hemoglobin on admission declined to 7.6.  INR normal.  Given 1 unit of PRBC with improvement in CBC to 8.0.  Underwent upper endoscopy yesterday which found 2 columns of grade 2 esophageal varices and 1 column of grade 3 esophageal varices involving the distal esophagus without bleeding stigmata.  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  Noted good hemostasis.  Recommended clear liquid diet, 5-day course of antibiotics, PPI twice daily with PPI infusion and octreotide.  PPI drip and Octreotide drip d/c yesterday evening. No on oral PPI  Dementia: Overall significant decline in past few months. Orientated to person and place but not time. Ammonia normal, doubt HE.   Abnormal CT: Thickened appearance of appendix and has appt as outpatient tomorrow w/ Dr Arnoldo Morale. I contacted his office to cancel appt at family request given will be admitted. Can reschedule after discharge. No RLQ pain.   Abdominal XRay shows foreign body (capsule) in the LUQ either stomach, small intestines, or transverse colon.   Plan: 1. Continue PPI bid 2. Monitor for any recurrent bleeding 3. Supportive measures 4. Outpatient follow-up with Dr. Arnoldo Morale 5. Outpatient follow-up with GI ([facility]) 6. Anticipated d/c  in the next 24-48 hours 7. Will need outpatient abdominal XRay on Monday to check for Givens Capsule retention   Thank you for allowing [place] to participate in the care of Woodman, DNP, AGNP-C Adult & Gerontological Nurse Practitioner [facility]    LOS: 1 day    01/19/2020, 9:48 AM

## 2020-01-19 NOTE — Telephone Encounter (Signed)
Please schedule outpatient hospital follow-up in 2-4 weeks.

## 2020-01-19 NOTE — Discharge Summary (Signed)
Physician Discharge Summary  Natalie Bridges ZHY:[id number] DOB: Jan 27, 1946 DOA: 01/17/2020  PCP: Natalie Burly, MD  Admit date: 01/17/2020 Discharge date: 01/19/2020  Time spent: 35 minutes  Recommendations for Outpatient Follow-up:  1. Repeat CBC to follow hemoglobin trend 2. Repeat basic metabolic panel to follow electrolytes and renal function and stability.   Discharge Diagnoses:  Principal Problem:   Upper GI bleed Active Problems:   Microcytic anemia   Esophageal varices (HCC)   Other cirrhosis of liver (HCC)   Acute on chronic blood loss anemia   Foreign body alimentary tract   Discharge Condition: Stable and improved.  Discharged home with instruction to follow-up with PCP and gastroenterology as an outpatient.  CODE STATUS: Full code.  Diet recommendation: Low-sodium diet.  Filed Weights   01/17/20 1133  Weight: 74.8 kg    History of present illness:  Natalie Bridges a 74 y.o.femalewith medical history significant ofchronic anemia, essential hypertension, hyperlipidemia, hypothyroidism, nonalcoholic liver cirrhosis,hx of GAVEand esophageal varices; who presented to the hospital secondary to acute on chronic anemia with findings on recent capsule endoscopy for active GI bleed. Patient denies chest pain, shortness of breath, nausea, vomiting or abdominal discomfort. At home there was description of slightly darker appearance in her stools but no frank melanotic changes. There is no focal neurologic deficits, no complaints of URI symptoms, no dysuria, no sick contacts, no fever chills or any other complaints. Overall history was limited due to patient underlying dementia.  ED Course:Hemoglobin 7.6,hemodynamically stable. No chest pain, no active nausea vomiting. 2 large IV ports in place. Patient typed and screened. Started on IV PPI and octreotide. GI service consulted and [facility] contacted to place patient in the hospital for further evaluation and  management.  Hospital Course:  1-acute on chronic blood loss anemiacute on chronic blood loss anemia in the setting of GI bleed -Patient with underlying history of esophageal varices and cirrhosis.  No active esophageal varices bleeding appreciated. -In the setting of GAVE; s/p ablation. -Status post 1 unit of PRBC; hemoglobin has remained stable in the 8.0-8.5 range. -Patient discharged on Niferex daily. -Repeat CBC at follow-up visit to follow hemoglobin trend. -Following GI recommendation after completing 48 hours of PPI in octreotide infusion will discharge home Protonix orally twice a day and outpatient follow-up. -Patient with history of still not passing givens capsula; will have x-rays again on 01/22/2020 and continue outpatient follow-up with gastroenterology. -SBP prophylaxis with Rocephin provided while inpatient; will complete 3 more days of cefdinir after discharge. -Soft diet recommended by GI.  2-history of dementia without behavioral disorder -Continue the use of Aricept -Continue constant reorientation and supportive care.. -Stable mood, no agitation and oriented x2 (place and person).  3-hypothyroidism -Continue Synthroid. -Continue to follow thyroid profile intermittently as an outpatient.  4-hypertension -Continue Norvasc -Vital signs stable -Heart healthy/low-sodium diet emphasized.  5-hyperlipidemia -Continue Lipitor  Procedures: Endoscopy 01/18/20: GAVE appreciated; s/p ablation  Consultations:  GI service  Discharge Exam: Vitals:   01/19/20 0500 01/19/20 1503  BP: (!) 143/54 129/65  Pulse: 81 79  Resp: 16 17  Temp: 98 F (36.7 C) 98 F (36.7 C)  SpO2: 93% 97%    General: No overt bleeding appreciated; patient has tolerated diet is in no acute distress and feeling ready to go home. Cardiovascular: Rate controlled, no rubs, no gallops, no JVD. Respiratory: Good air movement bilaterally; no using accessory muscles.  Normal respiratory  effort on room air. Abdomen: Soft, nontender, nondistended, positive bowel sounds Extremity: No cyanosis  or clubbing.  Discharge Instructions   Discharge Instructions    Diet - low sodium heart healthy   Complete by: As directed    Discharge instructions   Complete by: As directed    Maintain adequate hydration Take medication as prescribed Follow-up with GI service as instructed Follow-up with PCP in 10 days Take medications as prescribed.   Increase activity slowly   Complete by: As directed      Allergies as of 01/19/2020      Reactions   Codeine Nausea And Vomiting   Hydrocodone Nausea And Vomiting      Medication List    STOP taking these medications   IRON PO   Potassium Chloride ER 20 MEQ Tbcr     TAKE these medications   amLODipine 5 MG tablet Commonly known as: NORVASC Take 5 mg by mouth daily.   atorvastatin 10 MG tablet Commonly known as: LIPITOR Take 10 mg by mouth daily.   cefdinir 300 MG capsule Commonly known as: OMNICEF Take 1 capsule (300 mg total) by mouth 2 (two) times daily for 3 days.   donepezil 10 MG tablet Commonly known as: ARICEPT Take 10 mg by mouth at bedtime.   Fish Oil 1000 MG Caps Take 1,000 mg by mouth daily.   levothyroxine 25 MCG tablet Commonly known as: SYNTHROID Take 25 mcg by mouth daily before breakfast.   Niferex Tabs Take 1 tablet by mouth daily.   pantoprazole 40 MG tablet Commonly known as: PROTONIX Take 1 tablet (40 mg total) by mouth 2 (two) times daily. What changed: when to take this   risperiDONE 0.5 MG tablet Commonly known as: RISPERDAL Take 0.5 mg by mouth daily.      Allergies  Allergen Reactions  . Codeine Nausea And Vomiting  . Hydrocodone Nausea And Vomiting    Follow-up Information    Natalie Burly, MD. Schedule an appointment as soon as possible for a visit in 10 day(s).   Specialty: Internal Medicine Contact information: [place] [phone]  [phone]               The results of significant diagnostics from this hospitalization (including imaging, microbiology, ancillary and laboratory) are listed below for reference.    Significant Diagnostic Studies: DG Abd 1 View  Result Date: 01/19/2020 CLINICAL DATA:  Small-bowel capsule taken 01/16/2020 EXAM: ABDOMEN - 1 VIEW COMPARISON:  None. FINDINGS: Small-bowel capsule overlies the left upper quadrant. This is indeterminate for being in the stomach, small intestine or distal transverse colon. No sign of bowel obstruction. Surgical clips right upper quadrant. Chronic degenerative change of the spine. IMPRESSION: Small-bowel capsule overlies the left upper quadrant. This is indeterminate for being in the stomach, small intestine or distal transverse colon. Distal transverse colon would seem most likely given the timing described above. Electronically Signed   By: Nelson Chimes M.D.   On: 01/19/2020 11:14   CT Abdomen Pelvis W Contrast  Result Date: 01/02/2020 CLINICAL DATA:  74 year old female with weight loss and diarrhea. EXAM: CT ABDOMEN AND PELVIS WITH CONTRAST TECHNIQUE: Multidetector CT imaging of the abdomen and pelvis was performed using the standard protocol following bolus administration of intravenous contrast. CONTRAST:  142mL OMNIPAQUE IOHEXOL 300 MG/ML  SOLN COMPARISON:  CT abdomen pelvis report dated 06/21/2016. The images are not available for direct comparison. FINDINGS: Lower chest: The visualized lung bases are clear. No intra-abdominal free air. Small ascites. Hepatobiliary: Cirrhosis. Mild biliary ductal dilatation, likely post cholecystectomy.  No retained calcified stone noted in the central CBD. Pancreas: Unremarkable. No pancreatic ductal dilatation or surrounding inflammatory changes. Spleen: Mild splenomegaly measuring 15 mm in AP length. Adrenals/Urinary Tract: The adrenal glands unremarkable. There is no hydronephrosis on either side. There is symmetric  enhancement and excretion of contrast by both kidneys. The visualized ureters appear unremarkable. Mild thickened and trabeculated appearance of the bladder wall, likely related to chronic bladder dysfunction. Stomach/Bowel: There is mild diffuse thickening of the colon which may be related to hepatic colopathy although mild colitis is not excluded. There is apparent thickening of the appendix measuring approximately 15 mm in diameter. Faint density in the proximal lumen of the appendix may represent oral contrast or small stones. No significant periappendiceal inflammation. The thickened appearance of the appendix may be related to underlying ascites and liver disease. Acute appendicitis or an appendiceal lesion such as mucocele is not entirely excluded. Clinical correlation is recommended. If there is concern for appendiceal mass further evaluation with MRI is advised. Vascular/Lymphatic: There is moderate aortoiliac atherosclerotic disease. There is a 4 cm fusiform infrarenal abdominal aortic aneurysm (sagittal 80/6). The IVC is unremarkable. The SMV, splenic vein, and main portal vein are patent. No portal venous gas. Extensive upper abdominal varices including paraesophageal and gastric varices. No adenopathy. Reproductive: The uterus is grossly unremarkable. Two adjacent cysts or a septated cyst noted in the region of the right ovary with combined measurement of approximately 3.5 x 1.5 cm. Further evaluation with ultrasound is advised. Other: None Musculoskeletal: Degenerative changes of the spine. No acute osseous pathology. IMPRESSION: 1. Cirrhosis with evidence of portal hypertension, small ascites, and splenomegaly. 2. Thickened appearance of the appendix may be related to underlying ascites and liver disease. Acute appendicitis or an appendiceal lesion such as mucocele is not entirely excluded. Clinical correlation is recommended. If there is concern for appendiceal mass further evaluation with MRI is  advised. 3. Hepatic colopathy versus mild colitis. Clinical correlation is recommended. 4. A 4 cm fusiform infrarenal abdominal aortic aneurysm. Recommend follow-up every 12 months and vascular consultation. This recommendation follows ACR consensus guidelines: White Paper of the ACR Incidental Findings Committee II on Vascular Findings. J Am Coll Radiol 2013; 10:789-794. 5. Two adjacent cysts or a septated cyst in the region of the right ovary. Further evaluation with ultrasound is advised. 6. Aortic Atherosclerosis (ICD10-I70.0). Electronically Signed   By: Anner Crete M.D.   On: 01/02/2020 22:50    Microbiology: Recent Results (from the past 240 hour(s))  Resp Panel by RT PCR (RSV, Flu A&B, Covid) -     Status: None   Collection Time: 01/17/20  1:40 PM  Result Value Ref Range Status   SARS Coronavirus 2 by RT PCR NEGATIVE NEGATIVE Final    Comment: (NOTE) SARS-CoV-2 target nucleic acids are NOT DETECTED.  The SARS-CoV-2 RNA is generally detectable in upper respiratoy specimens during the acute phase of infection. The lowest concentration of SARS-CoV-2 viral copies this assay can detect is 131 copies/mL. A negative result does not preclude SARS-Cov-2 infection and should not be used as the sole basis for treatment or other patient management decisions. A negative result may occur with  improper specimen collection/handling, submission of specimen other than nasopharyngeal swab, presence of viral mutation(s) within the areas targeted by this assay, and inadequate number of viral copies (<131 copies/mL). A negative result must be combined with clinical observations, patient history, and epidemiological information. The expected result is Negative.  Fact Sheet for Patients:  PinkCheek.be  Fact Sheet for Healthcare Providers:  GravelBags.it  This test is no t yet approved or cleared by the [place] FDA and  has been  authorized for detection and/or diagnosis of SARS-CoV-2 by FDA under an Emergency Use Authorization (EUA). This EUA will remain  in effect (meaning this test can be used) for the duration of the COVID-19 declaration under Section 564(b)(1) of the Act, 21 U.S.C. section 360bbb-3(b)(1), unless the authorization is terminated or revoked sooner.     Influenza A by PCR NEGATIVE NEGATIVE Final   Influenza B by PCR NEGATIVE NEGATIVE Final    Comment: (NOTE) The Xpert Xpress SARS-CoV-2/FLU/RSV assay is intended as an aid in  the diagnosis of influenza from Nasopharyngeal swab specimens and  should not be used as a sole basis for treatment. Nasal washings and  aspirates are unacceptable for Xpert Xpress SARS-CoV-2/FLU/RSV  testing.  Fact Sheet for Patients: PinkCheek.be  Fact Sheet for Healthcare Providers: GravelBags.it  This test is not yet approved or cleared by the [place] FDA and  has been authorized for detection and/or diagnosis of SARS-CoV-2 by  FDA under an Emergency Use Authorization (EUA). This EUA will remain  in effect (meaning this test can be used) for the duration of the  Covid-19 declaration under Section 564(b)(1) of the Act, 21  U.S.C. section 360bbb-3(b)(1), unless the authorization is  terminated or revoked.    Respiratory Syncytial Virus by PCR NEGATIVE NEGATIVE Final    Comment: (NOTE) Fact Sheet for Patients: PinkCheek.be  Fact Sheet for Healthcare Providers: GravelBags.it  This test is not yet approved or cleared by the [place] FDA and  has been authorized for detection and/or diagnosis of SARS-CoV-2 by  FDA under an Emergency Use Authorization (EUA). This EUA will remain  in effect (meaning this test can be used) for the duration of the  COVID-19 declaration under Section 564(b)(1) of the Act, 21 U.S.C.  section 360bbb-3(b)(1),  unless the authorization is terminated or  revoked. Performed at [facility], [address], [place], [place] [address]      Labs: Basic Metabolic Panel: Recent Labs  Lab 01/17/20 1240  NA 138  K 3.5  CL 106  CO2 25  GLUCOSE 123*  BUN 12  CREATININE 0.78  CALCIUM 8.6*   Liver Function Tests: Recent Labs  Lab 01/17/20 1240  AST 31  ALT 14  ALKPHOS 53  BILITOT 0.8  PROT 5.9*  ALBUMIN 2.7*   Recent Labs  Lab 01/17/20 1240  LIPASE 27   Recent Labs  Lab 01/17/20 1521  AMMONIA 23   CBC: Recent Labs  Lab 01/17/20 1240 01/18/20 0443  WBC 2.7* 2.7*  NEUTROABS 1.2*  --   HGB 7.6* 8.0*  HCT 27.2* 28.2*  MCV 78.2* 79.4*  PLT 201 186   Signed:  Barton Dubois MD.  [facility] 01/19/2020, 3:46 PM

## 2020-01-22 ENCOUNTER — Telehealth: Payer: Medicare Other

## 2020-01-22 ENCOUNTER — Other Ambulatory Visit: Payer: Self-pay

## 2020-01-22 ENCOUNTER — Encounter: Payer: Self-pay | Admitting: Internal Medicine

## 2020-01-22 NOTE — Patient Outreach (Signed)
[facility]) Care Management  01/22/2020  Natalie Bridges [birth year] [id number]   Red Emmi: Date of call: 01/21/2020 Reason for alert:  Unfilled rx- yes  Placed call to patient and spoke with daughter in law due to patient having dementia.  Daughter in law reports to me that she took the automated call.  States patient does have all her medications and is taking them as prescribed.  Error in recording of answer. Daughter in law reports patient is doing very well.  PLAN: no needs. Error in recording of answer.  Tomasa Rand, RN, BSN, CEN [facility] [employer] [phone]

## 2020-01-22 NOTE — Telephone Encounter (Signed)
PATIENT SCHEDULED AND LETTER SENT  °

## 2020-01-24 ENCOUNTER — Telehealth: Payer: Self-pay | Admitting: Gastroenterology

## 2020-01-24 ENCOUNTER — Encounter (HOSPITAL_COMMUNITY): Payer: Self-pay | Admitting: Internal Medicine

## 2020-01-24 NOTE — Telephone Encounter (Signed)
[facility] clinical pool. Eric placed order for patient to have another abd xray to make sure capsule passed. She was inpatient at the time but needs to be done sometime this week or next. Can we please make sure order is appropriate for outpatient status and let pt know to have it done.

## 2020-01-24 NOTE — Telephone Encounter (Signed)
Order is in. Called pt. She states she can go on Monday. Aware no appt is needed and to go to [facility]

## 2020-02-04 ENCOUNTER — Telehealth: Payer: Self-pay | Admitting: Gastroenterology

## 2020-02-04 NOTE — Telephone Encounter (Signed)
Received labs that resulted 01/11/2020 at [facility] laboratory in [place].   IgA IgM non-reactive.   This is not the lab I ordered. Due to recent diagnosis of cirrhosis, I ordered Hep A Ab total, Hep B surface Ab, Hep B surface Ag, Hep B core Ab total, and Hep C Ab. Due to anemia, I had also ordered CBC and iron panel with ferritin at her last OV. Patient will still need to have these labs completed. Not sure if there is a problem with our lab orders and the [facility] lab.   Also, Randall Hiss had ordered an abdominal x-ray to verify that the patient passed the Given's capsule. This hasn't been completed. Can you remind patients daughter to take her to have this x-ray completed at [facility]. She can also have all labs completed at [facility] if that will make things easier. Elmo Putt, I suspect you may need to send all lab orders to [facility] if she is going to there as these orders are from separate dates.

## 2020-02-05 ENCOUNTER — Encounter: Payer: Medicare Other | Admitting: Vascular Surgery

## 2020-02-05 NOTE — Telephone Encounter (Signed)
Noted  

## 2020-02-05 NOTE — Telephone Encounter (Signed)
Spoke with pts  daughter. Pt will complete labs and Xray at [facility]. Pts daughter reports that they had a death in the family and will work on completing labs and Xray. Mailed a copy of lab orders per daughters request.

## 2020-02-18 NOTE — Telephone Encounter (Signed)
Natalie Bridges, it doesn't appear patient has had labs or abdominal x-ray completed as of yet. Can we call and remind patient/daughter in law about this one more time?

## 2020-02-19 NOTE — Telephone Encounter (Signed)
Noted. Dark stools could be secondary to oral iron. We will follow-up on labs and imaging once they are completed. She has upcoming appointment with our office on 12/29 that she will need to keep.

## 2020-02-19 NOTE — Telephone Encounter (Signed)
Correction. Pts daughter is aware of Charlsie Merles PA, recommendations and will keep f/u apt.

## 2020-02-19 NOTE — Telephone Encounter (Signed)
See phone note. Pt has been contacted.

## 2020-02-19 NOTE — Telephone Encounter (Signed)
Spoke with pts daughter in [employer]. Due to the death in their family, it made it hard for pt to get labs completed with xray. They do plan to have this done this week. Pt noticed some dark stool last week. Pt is taking Iron (Niferex) 150 mg daily.

## 2020-03-06 ENCOUNTER — Ambulatory Visit: Payer: Medicare Other | Admitting: Nurse Practitioner

## 2020-03-13 DIAGNOSIS — I469 Cardiac arrest, cause unspecified: Secondary | ICD-10-CM | POA: Diagnosis not present

## 2020-03-13 DIAGNOSIS — R404 Transient alteration of awareness: Secondary | ICD-10-CM | POA: Diagnosis not present

## 2020-03-13 DIAGNOSIS — R4020 Unspecified coma: Secondary | ICD-10-CM | POA: Diagnosis not present

## 2020-03-14 ENCOUNTER — Telehealth: Payer: Self-pay | Admitting: Internal Medicine

## 2020-03-14 NOTE — Telephone Encounter (Signed)
Noted. Sorry to hear this.  

## 2020-03-14 NOTE — Telephone Encounter (Signed)
Patient daughter called to let us know that the patient has passed away.  She wanted [place] to know how much they appreciated all that Dr. Jena Gauss and the office did for her.

## 2020-03-19 NOTE — Telephone Encounter (Signed)
Communication noted.  

## 2020-04-09 DIAGNOSIS — 419620001 Death: Secondary | SNOMED CT | POA: Diagnosis not present

## 2020-04-09 DEATH — deceased

## 2020-04-18 ENCOUNTER — Ambulatory Visit: Payer: Medicare Other | Admitting: Nurse Practitioner

## 2022-01-06 IMAGING — CT CT ABD-PELV W/ CM
2 of 5 series · 15 of 46 positions shown, 17 images · IV contrast (Omnipaque or Isovue)
Comparison: CT abdomen pelvis report dated 06/21/2016. The images
are not available for direct comparison.

CLINICAL DATA: 74-year-old female with weight loss and diarrhea.

EXAM:
CT ABDOMEN AND PELVIS WITH CONTRAST
TECHNIQUE: Multidetector CT imaging of the abdomen and pelvis was performed
using the standard protocol following bolus administration of
intravenous contrast.
CONTRAST:  100mL OMNIPAQUE IOHEXOL 300 MG/ML  SOLN

[Series 2: axial st · axial · 0.78mm/px · z∈[+894,+1329]mm · 12 of 99 slices shown, 14 images]
[im 6/99  soft-tissue]
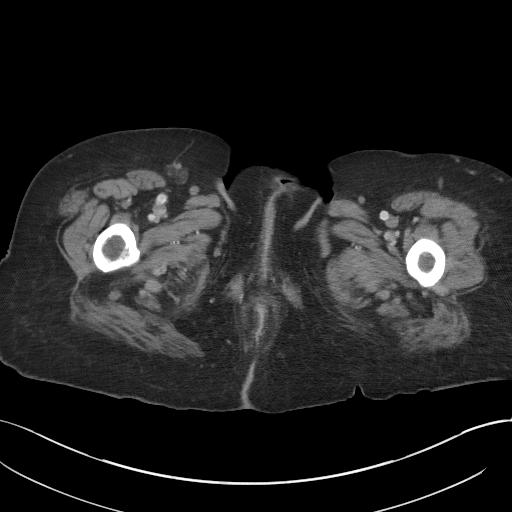
[im 6/99  bone]
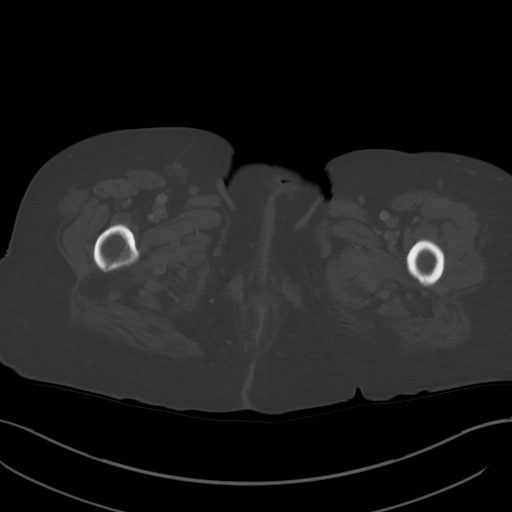
[im 18/99  soft-tissue]
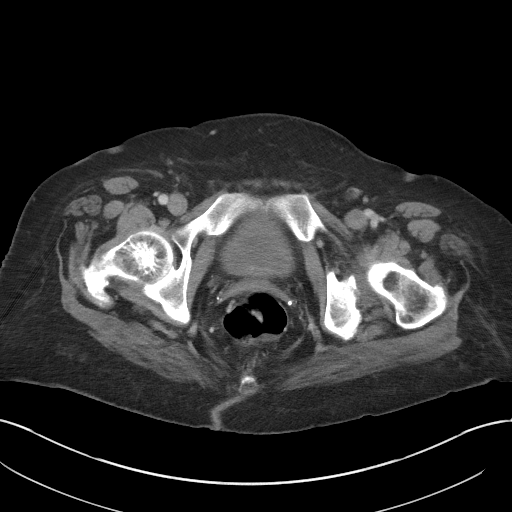
[im 24/99  soft-tissue]
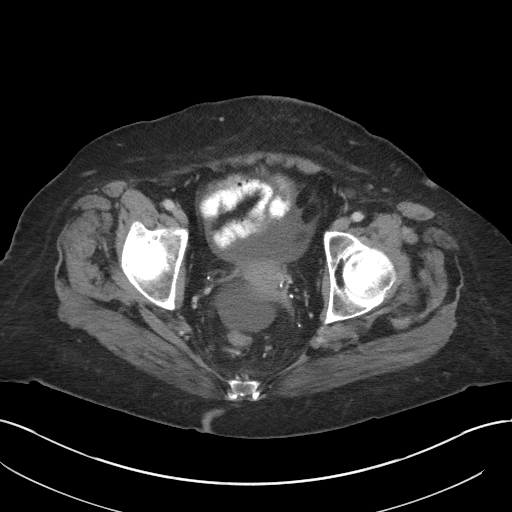
[im 29/99  soft-tissue]
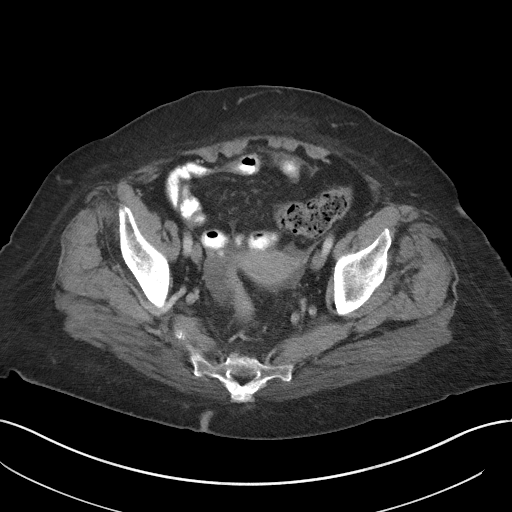
[im 41/99  soft-tissue]
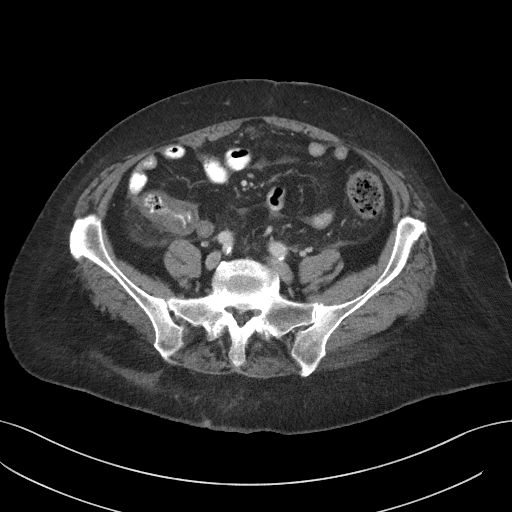
[im 47/99  soft-tissue]
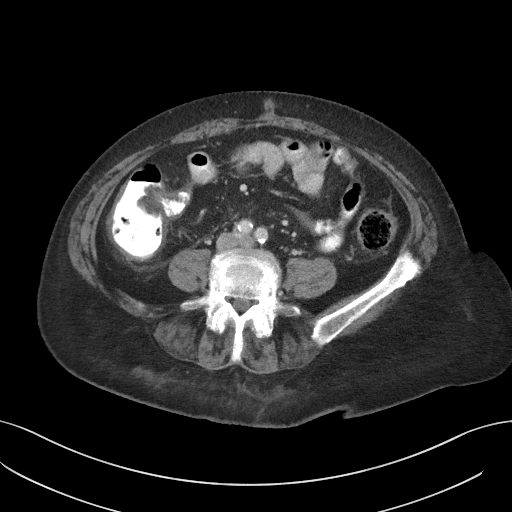
[im 52/99  soft-tissue]
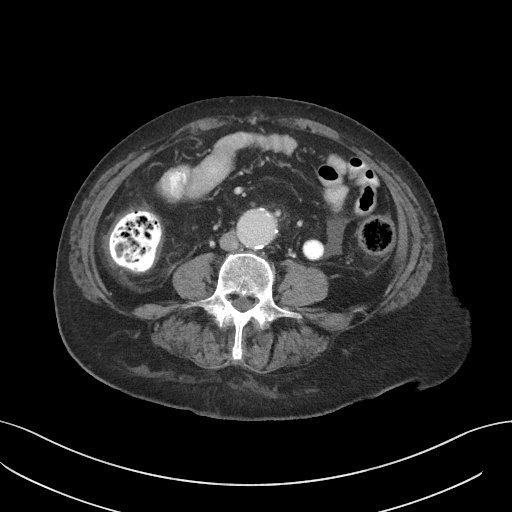
[im 64/99  soft-tissue]
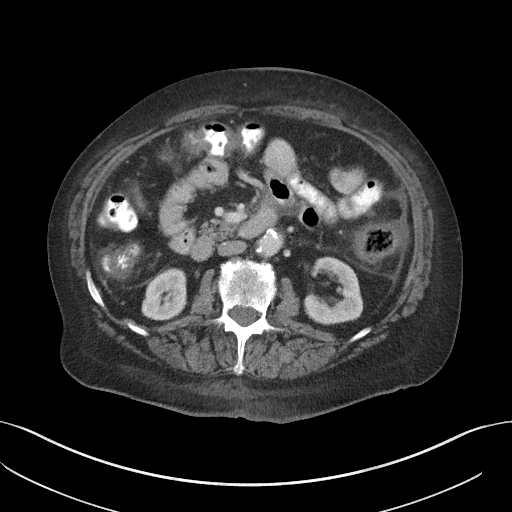
[im 70/99  soft-tissue]
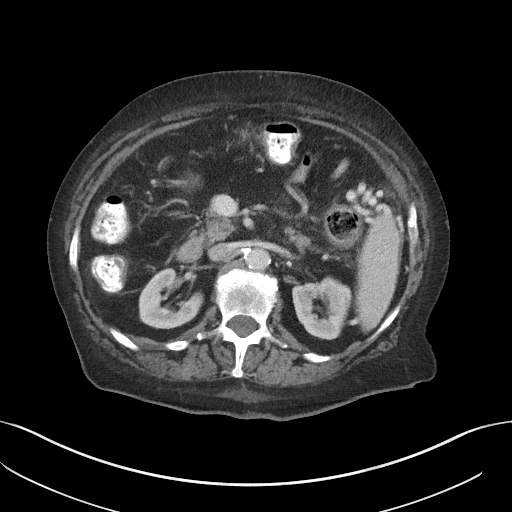
[im 70/99  bone]
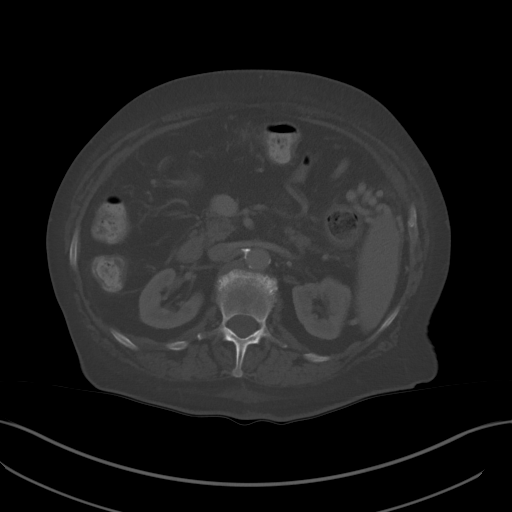
[im 75/99  soft-tissue]
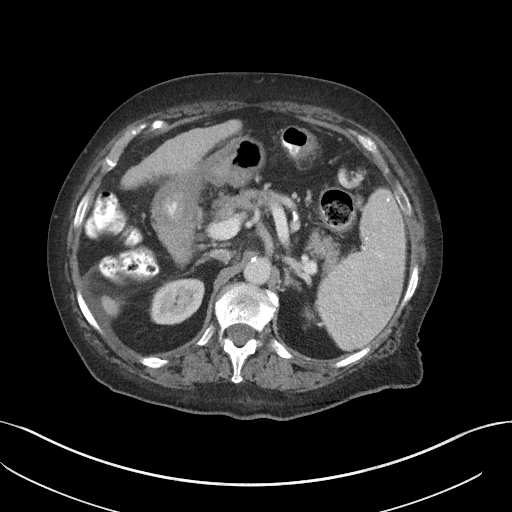
[im 87/99  soft-tissue]
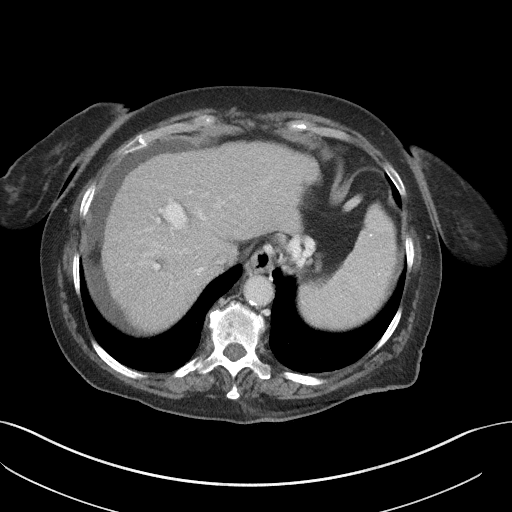
[im 93/99  soft-tissue]
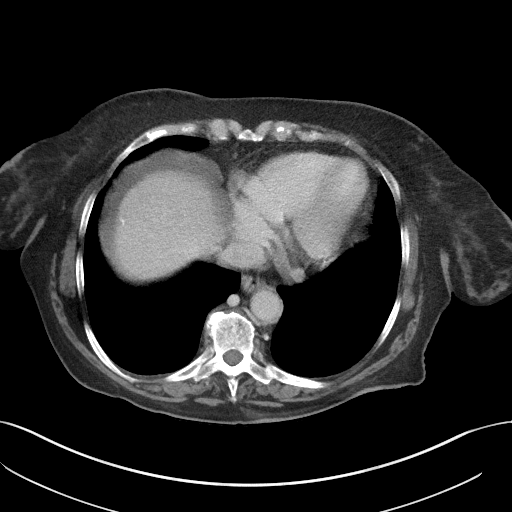

[Series 5: coronal st · coronal · 0.91mm/px · 3 of 101 slices shown]
[im 34/101  soft-tissue]
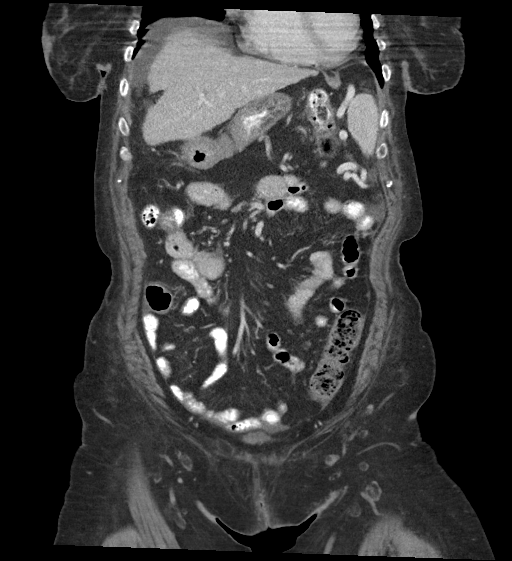
[im 45/101  soft-tissue]
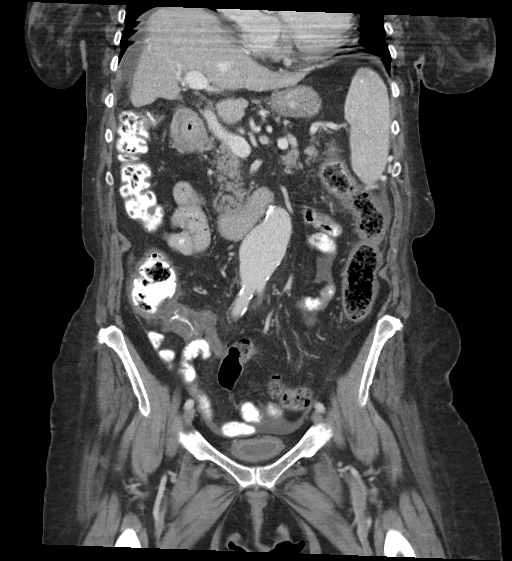
[im 56/101  soft-tissue]
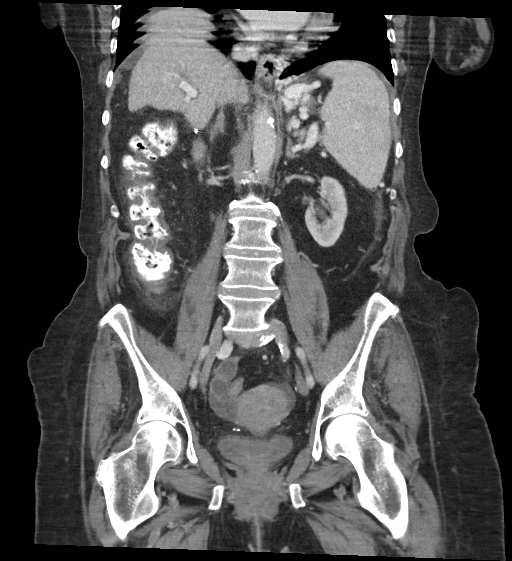

[15 of 46 positions shown; findings below may reference images not displayed]

FINDINGS: Lower chest: The visualized lung bases are clear.

No intra-abdominal free air. Small ascites.

Hepatobiliary: Cirrhosis. Mild biliary ductal dilatation, likely
post cholecystectomy. No retained calcified stone noted in the
central CBD.

Pancreas: Unremarkable. No pancreatic ductal dilatation or
surrounding inflammatory changes.

Spleen: Mild splenomegaly measuring 15 mm in AP length.

Adrenals/Urinary Tract: The adrenal glands unremarkable. There is no
hydronephrosis on either side. There is symmetric enhancement and
excretion of contrast by both kidneys. The visualized ureters appear
unremarkable. Mild thickened and trabeculated appearance of the
bladder wall, likely related to chronic bladder dysfunction.

Stomach/Bowel: There is mild diffuse thickening of the colon which
may be related to hepatic colopathy although mild colitis is not
excluded. There is apparent thickening of the appendix measuring
approximately 15 mm in diameter. Faint density in the proximal lumen
of the appendix may represent oral contrast or small stones. No
significant periappendiceal inflammation. The thickened appearance
of the appendix may be related to underlying ascites and liver
disease. Acute appendicitis or an appendiceal lesion such as
mucocele is not entirely excluded. Clinical correlation is
recommended. If there is concern for appendiceal mass further
evaluation with MRI is advised.

Vascular/Lymphatic: There is moderate aortoiliac atherosclerotic
disease. There is a 4 cm fusiform infrarenal abdominal aortic
aneurysm (sagittal 80/6). The IVC is unremarkable. The SMV, splenic
vein, and main portal vein are patent. No portal venous gas.
Extensive upper abdominal varices including paraesophageal and
gastric varices. No adenopathy.

Reproductive: The uterus is grossly unremarkable. Two adjacent cysts
or a septated cyst noted in the region of the right ovary with
combined measurement of approximately 3.5 x 1.5 cm. Further
evaluation with ultrasound is advised.

Other: None

Musculoskeletal: Degenerative changes of the spine. No acute osseous
pathology.
IMPRESSION: 1. Cirrhosis with evidence of portal hypertension, small ascites,
and splenomegaly.
2. Thickened appearance of the appendix may be related to underlying
ascites and liver disease. Acute appendicitis or an appendiceal
lesion such as mucocele is not entirely excluded. Clinical
correlation is recommended. If there is concern for appendiceal mass
further evaluation with MRI is advised.
3. Hepatic colopathy versus mild colitis. Clinical correlation is
recommended.
4. A 4 cm fusiform infrarenal abdominal aortic aneurysm. Recommend
follow-up every 12 months and vascular consultation. This
recommendation follows ACR consensus guidelines: White Paper of the
ACR Incidental Findings Committee II on Vascular Findings. [HOSPITAL] 7950; [DATE].
5. Two adjacent cysts or a septated cyst in the region of the right
ovary. Further evaluation with ultrasound is advised.
6. Aortic Atherosclerosis ([id number]).
